# Patient Record
Sex: Female | Born: 1975 | Race: Black or African American | Hispanic: No | Marital: Married | Smoking: Never smoker
Health system: Southern US, Community
[De-identification: ages and names within clinical notes are randomized; demographics above are authoritative.]

## PROBLEM LIST (undated history)

## (undated) DIAGNOSIS — I10 Essential (primary) hypertension: Secondary | ICD-10-CM

---

## 2017-03-31 ENCOUNTER — Ambulatory Visit (INDEPENDENT_AMBULATORY_CARE_PROVIDER_SITE_OTHER): Payer: Self-pay | Admitting: *Deleted

## 2017-03-31 ENCOUNTER — Encounter: Payer: Self-pay | Admitting: *Deleted

## 2017-03-31 ENCOUNTER — Inpatient Hospital Stay (HOSPITAL_COMMUNITY)
Admission: AD | Admit: 2017-03-31 | Discharge: 2017-03-31 | Disposition: A | Discharge status: Home / Self Care | Payer: Self-pay | Source: Ambulatory Visit | Attending: Obstetrics and Gynecology | Admitting: Obstetrics and Gynecology

## 2017-03-31 DIAGNOSIS — Z538 Procedure and treatment not carried out for other reasons: Secondary | ICD-10-CM | POA: Insufficient documentation

## 2017-03-31 DIAGNOSIS — Z3200 Encounter for pregnancy test, result unknown: Secondary | ICD-10-CM

## 2017-03-31 DIAGNOSIS — Z3201 Encounter for pregnancy test, result positive: Secondary | ICD-10-CM

## 2017-03-31 LAB — POCT PREGNANCY, URINE: Preg Test, Ur: POSITIVE — AB

## 2017-03-31 NOTE — MAU Note (Signed)
Is preg, here for a check up.  Having no problems.  Denies any pain or  Bleeding.  +HPT 4 wks ago.

## 2017-03-31 NOTE — Progress Notes (Signed)
Here for pregnancy test which was positive. States not taking any medications now except folic acid. Not sure where she get prenatal care. States LMP was 02/07/17, states she has regular periods and is sure of date. Given letter for proof of pregnancy.

## 2017-03-31 NOTE — MAU Provider Note (Signed)
Patient presents to MAU in stable condition with reports of (+) HPT and desire to start prenatal care.  She has no complaints.   Advised to go to [place] and schedule OB appt and establish care there. She and her spouse went to [facility] first and was instructed to come here for prenatal care. Notified that MAU would be the location for her to come for [facility] emergencies and labor. Patient verbalized an understanding of the plan of care and agrees.  Patient and spouse given directions to [place] from MAU.   Laury Deep, CNM 03/31/2017 11:47 AM

## 2017-04-02 NOTE — Progress Notes (Signed)
Agree with nursing staff's documentation of this patient's clinic encounter.  Verita Schneiders, MD

## 2017-05-06 ENCOUNTER — Encounter: Payer: Self-pay | Admitting: Medical

## 2017-05-06 ENCOUNTER — Ambulatory Visit (INDEPENDENT_AMBULATORY_CARE_PROVIDER_SITE_OTHER): Payer: Self-pay | Admitting: Medical

## 2017-05-06 DIAGNOSIS — Z113 Encounter for screening for infections with a predominantly sexual mode of transmission: Secondary | ICD-10-CM

## 2017-05-06 DIAGNOSIS — O10911 Unspecified pre-existing hypertension complicating pregnancy, first trimester: Secondary | ICD-10-CM

## 2017-05-06 DIAGNOSIS — O34219 Maternal care for unspecified type scar from previous cesarean delivery: Secondary | ICD-10-CM

## 2017-05-06 DIAGNOSIS — Z124 Encounter for screening for malignant neoplasm of cervix: Secondary | ICD-10-CM

## 2017-05-06 DIAGNOSIS — Z1151 Encounter for screening for human papillomavirus (HPV): Secondary | ICD-10-CM

## 2017-05-06 DIAGNOSIS — O10919 Unspecified pre-existing hypertension complicating pregnancy, unspecified trimester: Secondary | ICD-10-CM | POA: Insufficient documentation

## 2017-05-06 DIAGNOSIS — O0990 Supervision of high risk pregnancy, unspecified, unspecified trimester: Secondary | ICD-10-CM | POA: Insufficient documentation

## 2017-05-06 DIAGNOSIS — O09521 Supervision of elderly multigravida, first trimester: Secondary | ICD-10-CM | POA: Insufficient documentation

## 2017-05-06 DIAGNOSIS — Z98891 History of uterine scar from previous surgery: Secondary | ICD-10-CM | POA: Insufficient documentation

## 2017-05-06 LAB — POCT URINALYSIS DIP (DEVICE)
Bilirubin Urine: NEGATIVE
GLUCOSE, UA: NEGATIVE mg/dL
Ketones, ur: NEGATIVE mg/dL
Leukocytes, UA: NEGATIVE
Nitrite: NEGATIVE
PH: 5.5 (ref 5.0–8.0)
PROTEIN: NEGATIVE mg/dL
SPECIFIC GRAVITY, URINE: 1.01 (ref 1.005–1.030)
UROBILINOGEN UA: 0.2 mg/dL (ref 0.0–1.0)

## 2017-05-06 NOTE — Progress Notes (Signed)
.    Subjective:    Sharon Harvey is a G2P1001 [redacted]w[redacted]d being seen today for her first obstetrical visit.  Her obstetrical history is significant for advanced maternal age and previous c-section. Patient does intend to breast feed. Pregnancy history fully reviewed. Last pregnancy was 5 years ago in [place]. It was c-section birth.  Baby weighed 3.4 kg. No history of hypertension. She is taking her prenatal vitamin.   Patient reports no complaints.  Vitals:   05/06/17 1450 05/06/17 1505 05/06/17 1507  BP: (!) 148/74  (!) 141/82  Pulse: 94  94  Weight: 195 lb 14.4 oz (88.9 kg)    Height:  5\' 4"  (1.626 m)     HISTORY: OB History  Gravida Para Term Preterm AB Living  2 1 1     1   SAB TAB Ectopic Multiple Live Births          1    # Outcome Date GA Lbr Len/2nd Weight Sex Delivery Anes PTL Lv  2 Current           1 Term 09/28/11 [redacted]w[redacted]d   F CS-LTranv Spinal N LIV     History reviewed. No pertinent past medical history. History reviewed. No pertinent surgical history. History reviewed. No pertinent family history.   Exam    Uterus:     Pelvic Exam:    Perineum: No Hemorrhoids, Normal Perineum   Vulva: normal   Vagina:  normal mucosa, normal discharge       Cervix: no cervical motion tenderness and no lesions   Adnexa: normal adnexa and no mass, fullness, tenderness      System:     Skin: normal coloration and turgor, no rashes    Neurologic: oriented, normal, normal mood   Extremities: normal strength, tone, and muscle mass   HEENT PERRLA and extra ocular movement intact   Mouth/Teeth mucous membranes moist, pharynx normal without lesions and dental hygiene good   Neck supple and no masses   Cardiovascular: regular rate and rhythm, no murmurs or gallops   Respiratory:  appears well, vitals normal, no respiratory distress, acyanotic, normal RR, ear and throat exam is normal, neck free of mass or lymphadenopathy, chest clear, no wheezing, crepitations, rhonchi, normal symmetric  air entry   Abdomen: soft, non-tender; bowel sounds normal; no masses,  no organomegaly   Urinary: urethral meatus normal      Assessment:    Pregnancy: G2P1001 Patient Active Problem List   Diagnosis Date Noted  . Supervision of other normal pregnancy, antepartum 05/06/2017  . History of cesarean section 05/06/2017  . AMA (advanced maternal age) multigravida 93+, first trimester 05/06/2017  . Chronic hypertension affecting pregnancy 05/06/2017        Plan:     Initial labs drawn. Prenatal vitamins. Problem list reviewed and updated. Genetic Screening discussed. Recommended genetic screening. Patient is self pay and not [place] Citizen, so she and husband are concerned about cost for this.  Ultrasound discussed; fetal survey: ordered.  Follow up in 4 weeks.  [employer] 05/06/2017

## 2017-05-08 LAB — OBSTETRIC PANEL, INCLUDING HIV
ANTIBODY SCREEN: NEGATIVE
Basophils Absolute: 0 10*3/uL (ref 0.0–0.2)
Basos: 0 %
EOS (ABSOLUTE): 0.1 10*3/uL (ref 0.0–0.4)
Eos: 1 %
HEMATOCRIT: 31.8 % — AB (ref 34.0–46.6)
HEMOGLOBIN: 10.9 g/dL — AB (ref 11.1–15.9)
HEP B S AG: NEGATIVE
HIV Screen 4th Generation wRfx: NONREACTIVE
IMMATURE GRANS (ABS): 0 10*3/uL (ref 0.0–0.1)
IMMATURE GRANULOCYTES: 0 %
Lymphocytes Absolute: 1.5 10*3/uL (ref 0.7–3.1)
Lymphs: 26 %
MCH: 22.1 pg — AB (ref 26.6–33.0)
MCHC: 34.3 g/dL (ref 31.5–35.7)
MCV: 65 fL — AB (ref 79–97)
MONOCYTES: 12 %
MONOS ABS: 0.7 10*3/uL (ref 0.1–0.9)
Neutrophils Absolute: 3.4 10*3/uL (ref 1.4–7.0)
Neutrophils: 61 %
Platelets: 277 10*3/uL (ref 150–379)
RBC: 4.93 x10E6/uL (ref 3.77–5.28)
RDW: 17.8 % — ABNORMAL HIGH (ref 12.3–15.4)
RPR: NONREACTIVE
Rh Factor: POSITIVE
Rubella Antibodies, IGG: <0.9 index — ABNORMAL LOW (ref >0.99)
WBC: 5.6 10*3/uL (ref 3.4–10.8)

## 2017-05-08 LAB — CYTOLOGY - PAP
CHLAMYDIA, DNA PROBE: NEGATIVE
Diagnosis: NEGATIVE
HPV (WINDOPATH): NOT DETECTED
NEISSERIA GONORRHEA: NEGATIVE

## 2017-05-08 LAB — URINE CULTURE, OB REFLEX

## 2017-05-08 LAB — HEMOGLOBINOPATHY EVALUATION
HEMOGLOBIN A2 QUANTITATION: 2.4 % (ref 1.8–3.2)
HEMOGLOBIN F QUANTITATION: 0 % (ref 0.0–2.0)
HGB A: 97.6 % (ref 96.4–98.8)
HGB C: 0 %
HGB S: 0 %
HGB VARIANT: 0 %

## 2017-05-08 LAB — AFP TUMOR MARKER: AFP, SERUM, TUMOR MARKER: 16.2 ng/mL — AB (ref 0.0–8.3)

## 2017-05-08 LAB — CULTURE, OB URINE

## 2017-05-14 ENCOUNTER — Telehealth: Payer: Self-pay | Admitting: *Deleted

## 2017-05-14 DIAGNOSIS — O09521 Supervision of elderly multigravida, first trimester: Secondary | ICD-10-CM

## 2017-05-14 DIAGNOSIS — Z3480 Encounter for supervision of other normal pregnancy, unspecified trimester: Secondary | ICD-10-CM

## 2017-05-14 DIAGNOSIS — O10919 Unspecified pre-existing hypertension complicating pregnancy, unspecified trimester: Secondary | ICD-10-CM

## 2017-05-14 NOTE — Telephone Encounter (Signed)
Sharon Harvey is 98 w6d and that is cutoff for nuchal.  She can have panorama or quad screen at next visit.

## 2017-05-14 NOTE — Telephone Encounter (Signed)
-----   Message from [place], [place] sent at 05/12/2017  2:32 PM EST ----- Please schedule patient for nuchal translucency ultrasound. She and I discussed this at her last visit.

## 2017-06-05 ENCOUNTER — Encounter: Payer: Self-pay | Admitting: Student

## 2017-06-05 ENCOUNTER — Ambulatory Visit (INDEPENDENT_AMBULATORY_CARE_PROVIDER_SITE_OTHER): Payer: Self-pay | Admitting: Student

## 2017-06-05 VITALS — BP 123/80 | HR 91 | Wt 198.6 lb

## 2017-06-05 DIAGNOSIS — Z3480 Encounter for supervision of other normal pregnancy, unspecified trimester: Secondary | ICD-10-CM

## 2017-06-05 DIAGNOSIS — O09521 Supervision of elderly multigravida, first trimester: Secondary | ICD-10-CM

## 2017-06-05 DIAGNOSIS — O09522 Supervision of elderly multigravida, second trimester: Secondary | ICD-10-CM

## 2017-06-05 DIAGNOSIS — O10919 Unspecified pre-existing hypertension complicating pregnancy, unspecified trimester: Secondary | ICD-10-CM

## 2017-06-05 DIAGNOSIS — O10912 Unspecified pre-existing hypertension complicating pregnancy, second trimester: Secondary | ICD-10-CM

## 2017-06-05 DIAGNOSIS — Z3482 Encounter for supervision of other normal pregnancy, second trimester: Secondary | ICD-10-CM

## 2017-06-05 DIAGNOSIS — Z98891 History of uterine scar from previous surgery: Secondary | ICD-10-CM

## 2017-06-05 NOTE — Patient Instructions (Signed)
Pregnancy After Age 41 Women who become pregnant after the age of 41 have a higher risk for certain problems during pregnancy. This is because older women may already have health problems before becoming pregnant. Older women who are healthy before pregnancy may still develop problems during pregnancy. These problems may affect the mother, the unborn baby (fetus), or both. What are the risks for me? If you are over age 14 and you want to become pregnant or are pregnant, you may have a higher risk of:  Not being able to get pregnant (infertility).  Going into labor early (preterm labor).  Needing surgical delivery of your baby (cesarean delivery, or C-section).  Having high blood pressure (hypertension).  Having complications during pregnancy, such as high blood pressure and other symptoms (preeclampsia).  Having diabetes during pregnancy (gestational diabetes).  Being pregnant with more than one baby.  Loss of the unborn baby before 20 weeks (miscarriage) or after 20 weeks of pregnancy (stillbirth).  What are the risks for my baby? Babies born to women over the age of 9 have a higher risk for:  Being born early (prematurity).  Low birth weight, which is less than 5 lb, 8 oz (2.5 kg).  Birth defects, such as Down syndrome and cleft palate.  Health complications, including problems with growth and development.  How is prenatal care different for women over age 30? All women should see their health care provider before they try to become pregnant. This is especially important for women over the age of 70. Tell your health care provider about:  Any health problems you have.  Any medicines you take.  Any family history of health problems or chromosome-related defects.  Any problems you have had with past pregnancies or deliveries.  If you are over age 57 and you plan to become pregnant:  Start taking a daily multivitamin a month or more before you try to get pregnant. Your  multivitamin should contain 400 mcg (micrograms) of folic acid.  If you are over age 43 and pregnant, make sure you:  Keep taking your multivitamin unless your health care provider tells you not to take it.  Keep all prenatal visits as told by your health care provider. This is important.  Have ultrasounds regularly throughout your pregnancy to check for problems.  Talk with your health care provider about other prenatal screening tests that you may need.  What additional prenatal tests are needed? Screening tests show whether your baby has a higher risk for birth defects than other babies. Screening tests include:  Ultrasound tests to look for markers that indicate a risk for birth defects.  Maternal blood screening. These are blood tests that measure certain substances in your blood to determine your baby's risk for defects.  Screening tests do not show whether your baby has or does not have defects. They only show your baby's risk for certain defects. If your screening tests show that risk factors are present, you may need tests to confirm the defect (diagnostic testing). These tests may include:  Chorionic villus sampling. For this procedure, a tissue sample is taken from the organ that forms in your uterus to nourish your baby (placenta). The sample is removed through your cervix or abdomen and tested.  Amniocentesis. For this procedure, a small amount of the fluid that surrounds the baby in the uterus (amniotic fluid) is removed and tested.  What can I do to stay healthy during my pregnancy? Staying healthy during pregnancy can help you and your  baby to have a lower risk for problems during pregnancy, during delivery, or both. Talk with your health care provider for specific instructions about staying healthy during your pregnancy. Nutrition  At each meal, eat a variety of foods from each of the five food groups. These groups include: ? Proteins such as lean meats, poultry, fish  that is low in fat, beans, eggs, and nuts. ? Vegetables such as leafy greens, raw and cooked vegetables, and vegetable juice. ? Fruits that are fresh, frozen, or canned, or 100% fruit juice. ? Dairy products such as low-fat yogurt, cheese, and milk. ? Whole grains including rice, cereal, pasta, and bread.  Talk with your health care provider about how much food in each group is right for you.  Follow instructions from your health care provider about eating and drinking restrictions during pregnancy. ? Do not eat raw eggs, raw meat, or raw fish or seafood. ? Do not eat any fish that contains high amounts of mercury, such as swordfish or mackerel.  Drink 6-8 or more glasses of water a day. You should drink enough fluid to keep your urine pale yellow. Managing weight gain  Ask your health care provider how much weight gain is healthy during pregnancy.  Stay at a healthy weight. If needed, work with your health care provider to lose weight safely. Activity  Exercise regularly, as directed by your health care provider. Ask your health care provider what forms of exercise are safe for you. General instructions  Do not use any products that contain nicotine or tobacco, such as cigarettes and e-cigarettes. If you need help quitting, ask your health care provider.  Do not drink alcohol, use drugs, or abuse prescription medicine.  Take over-the-counter and prescription medicines only as told by your health care provider.  Do not use hot tubs, steam rooms, or saunas.  Talk with your health care provider about your risk of exposure to harmful environmental conditions. This includes exposure to chemicals, radiation, cleaning products, and cat feces. Follow advice from your health care provider about how to limit your exposure. Summary  Women who become pregnant after the age of 54 have a higher risk for complications during pregnancy.  Problems may affect the mother, the unborn baby (fetus),  or both.  All women should see their health care provider before they try to become pregnant. This is especially important for women over the age of 77.  Staying healthy during pregnancy can help both you and your baby to have a lower risk for some of the problems that can happen during pregnancy, during delivery, or both. This information is not intended to replace advice given to you by your health care provider. Make sure you discuss any questions you have with your health care provider. Document Released: 09/16/2016 Document Revised: 09/16/2016 Document Reviewed: 09/16/2016 Elsevier Interactive Patient Education  2018 [employer].

## 2017-06-05 NOTE — Progress Notes (Signed)
   PRENATAL VISIT NOTE  Subjective:  Sharon Harvey is a 41 y.o. G2P1001 at [redacted]w[redacted]d being seen today for ongoing prenatal care.  She is currently monitored for the following issues for this high-risk pregnancy and has Supervision of other normal pregnancy, antepartum; History of cesarean section; AMA (advanced maternal age) multigravida 5+, first trimester; and Chronic hypertension affecting pregnancy on their problem list.  Patient reports no complaints.  Contractions: Not present. Vag. Bleeding: None.  Movement: Present. Denies leaking of fluid.   The following portions of the patient's history were reviewed and updated as appropriate: allergies, current medications, past family history, past medical history, past social history, past surgical history and problem list. Problem list updated.  Objective:   Vitals:   06/05/17 1603  BP: 123/80  Pulse: 91  Weight: 198 lb 9.6 oz (90.1 kg)    Fetal Status: Fetal Heart Rate (bpm): 145   Movement: Present     General:  Alert, oriented and cooperative. Patient is in no acute distress.  Skin: Skin is warm and dry. No rash noted.   Cardiovascular: Normal heart rate noted  Respiratory: Normal respiratory effort, no problems with respiration noted  Abdomen: Soft, gravid, appropriate for gestational age.  Pain/Pressure: Absent     Pelvic: Cervical exam deferred        Extremities: Normal range of motion.  Edema: None  Mental Status:  Normal mood and affect. Normal behavior. Normal judgment and thought content.   Assessment and Plan:  Pregnancy: G2P1001 at [redacted]w[redacted]d  1. Supervision of other normal pregnancy, antepartum Doing well; patient wants to get on pregnancy medicaid before she gets her [place].   2. AMA (advanced maternal age) multigravida 63+, first trimester Will offer genetic counseling at next visit (57 weeks) Knows that we will be doing extra monitoring during pregnancy because of her age.   3. Chronic hypertension affecting pregnancy BP  normal today  4. History of cesarean section Will discuss VBAC at next visit  Preterm labor symptoms and general obstetric precautions including but not limited to vaginal bleeding, contractions, leaking of fluid and fetal movement were reviewed in detail with the patient. Please refer to After Visit Summary for other counseling recommendations.  Return in about 4 weeks (around 07/03/2017).   Starr Lake, CNM

## 2017-06-10 NOTE — L&D Delivery Note (Addendum)
Delivery Note At 4:46 PM a viable female was delivered via Vaginal, Spontaneous (Presentation:vertex ; ROA  ).  APGAR: 5, 8; weight 7 lb 2.1 oz (3235 g).   Placenta status: spontaneous , intact .  Cord: 3 vessel  with the following complications: none .  Cord pH: 7.2  Anesthesia:  Epidural Episiotomy:  no Lacerations:  Periurethral, sulcal and second degree perineal Suture Repair: 3.0 monocryl Est. Blood Loss (mL):  1030ml  Sharon Harvey is a 42 y.o. female now G2P2002 IUP at [redacted]w[redacted]d admitted for SROM @6  PM .  She progressed with augmentation with Pitocin to complete and pushed about 5 minutes to deliver.  Cord clamped immediately due to late decels right before delivery. Cord pH sent. Placenta intact and spontaneous, significant bleeding 2/2 multiple tears, mostly coming from deep sulcal tear.  All lacerations repaired without difficulty.  Mom and baby stable prior to transfer to postpartum. She plans on breastfeeding.   Mom to postpartum.  Baby to Couplet care / Skin to Skin.  [name], MD, PGY-1 [facility] [place] 11/03/2017, 6:08 PM  OB FELLOW DELIVERY ATTESTATION  I was gloved and present for the delivery in its entirety, and I agree with the above resident's note.    VBAC.  Deep sulcal laceration with significant bleeding. Repaired with 2.0 Vicryl, and 2nd perineal laceration repaired with 3.0 Monocryl with good hemostasis achieved. QBL 1040 mL (mostly from lacerations). Fundus firm.   Gailen Shelter, MD OB Fellow 11:22 PM

## 2017-07-04 ENCOUNTER — Ambulatory Visit (INDEPENDENT_AMBULATORY_CARE_PROVIDER_SITE_OTHER): Payer: Medicaid Other | Admitting: Obstetrics and Gynecology

## 2017-07-04 VITALS — BP 127/73 | HR 110 | Wt 208.0 lb

## 2017-07-04 DIAGNOSIS — O09522 Supervision of elderly multigravida, second trimester: Secondary | ICD-10-CM

## 2017-07-04 DIAGNOSIS — Z98891 History of uterine scar from previous surgery: Secondary | ICD-10-CM

## 2017-07-04 DIAGNOSIS — O09521 Supervision of elderly multigravida, first trimester: Secondary | ICD-10-CM

## 2017-07-04 DIAGNOSIS — O10912 Unspecified pre-existing hypertension complicating pregnancy, second trimester: Secondary | ICD-10-CM

## 2017-07-04 DIAGNOSIS — O0990 Supervision of high risk pregnancy, unspecified, unspecified trimester: Secondary | ICD-10-CM

## 2017-07-04 DIAGNOSIS — O0992 Supervision of high risk pregnancy, unspecified, second trimester: Secondary | ICD-10-CM

## 2017-07-04 DIAGNOSIS — O10919 Unspecified pre-existing hypertension complicating pregnancy, unspecified trimester: Secondary | ICD-10-CM

## 2017-07-04 NOTE — Progress Notes (Signed)
Subjective:  Sharon Harvey is a 42 y.o. G2P1001 at [redacted]w[redacted]d being seen today for ongoing prenatal care.  She is currently monitored for the following issues for this high-risk pregnancy and has Supervision of high risk pregnancy, antepartum; History of cesarean section; AMA (advanced maternal age) multigravida 4+, first trimester; and Chronic hypertension affecting pregnancy on their problem list.  Patient reports no complaints.  Contractions: Not present. Vag. Bleeding: None.  Movement: Present. Denies leaking of fluid.   The following portions of the patient's history were reviewed and updated as appropriate: allergies, current medications, past family history, past medical history, past social history, past surgical history and problem list. Problem list updated.  Objective:   Vitals:   07/04/17 1614 07/04/17 1623  BP: 134/81 127/73  Pulse: (!) 110   Weight: 94.3 kg (208 lb)     Fetal Status: Fetal Heart Rate (bpm): 148 Fundal Height: 20 cm Movement: Present     General:  Alert, oriented and cooperative. Patient is in no acute distress.  Skin: Skin is warm and dry. No rash noted.   Cardiovascular: Normal heart rate noted  Respiratory: Normal respiratory effort, no problems with respiration noted  Abdomen: Soft, gravid, appropriate for gestational age. Pain/Pressure: Absent     Pelvic: Vag. Bleeding: None     Cervical exam deferred        Extremities: Normal range of motion.  Edema: None  Mental Status: Normal mood and affect. Normal behavior. Normal judgment and thought content.   Urinalysis:      Assessment and Plan:  Pregnancy: G2P1001 at [redacted]w[redacted]d  1. Chronic hypertension affecting pregnancy BPs controlled today. Not on medications.   2. Supervision of high risk pregnancy, antepartum Doing well. Anatomy US scheduled.   3. AMA (advanced maternal age) multigravida 59+, first trimester Patient offered genetic counseling but declined at this time. Wants to wait until [place] results.  Also patient was ordered wrong quad screen so unable to interpret results. Discussed need to redraw and patient declined at this time. Offer again at next visit.  4. History of cesarean section Risk and benefits for repeat c/s and VBAC discussed. Patient unsure of if she wants to VBAC. Discuss again at next visit. Prior c/s was an elective section without medical indication per patient. This was done [place].  Preterm labor symptoms and general obstetric precautions including but not limited to vaginal bleeding, contractions, leaking of fluid and fetal movement were reviewed in detail with the patient. Please refer to After Visit Summary for other counseling recommendations.  Return in about 4 weeks (around 08/01/2017) for ob visit.   Katheren Shams, DO

## 2017-07-05 ENCOUNTER — Encounter: Payer: Self-pay | Admitting: Obstetrics and Gynecology

## 2017-07-08 ENCOUNTER — Other Ambulatory Visit: Payer: Self-pay

## 2017-07-08 DIAGNOSIS — O10919 Unspecified pre-existing hypertension complicating pregnancy, unspecified trimester: Secondary | ICD-10-CM

## 2017-07-08 DIAGNOSIS — O09521 Supervision of elderly multigravida, first trimester: Secondary | ICD-10-CM

## 2017-07-10 ENCOUNTER — Encounter (HOSPITAL_COMMUNITY): Payer: Self-pay

## 2017-07-10 ENCOUNTER — Other Ambulatory Visit: Payer: Self-pay | Admitting: Family Medicine

## 2017-07-10 ENCOUNTER — Ambulatory Visit (HOSPITAL_COMMUNITY)
Admission: RE | Admit: 2017-07-10 | Discharge: 2017-07-10 | Disposition: A | Discharge status: Home / Self Care | Payer: Medicaid Other | Source: Ambulatory Visit | Attending: Family Medicine | Admitting: Family Medicine

## 2017-07-10 DIAGNOSIS — Z3689 Encounter for other specified antenatal screening: Secondary | ICD-10-CM

## 2017-07-10 DIAGNOSIS — Z3A22 22 weeks gestation of pregnancy: Secondary | ICD-10-CM | POA: Diagnosis not present

## 2017-07-10 DIAGNOSIS — O10012 Pre-existing essential hypertension complicating pregnancy, second trimester: Secondary | ICD-10-CM | POA: Insufficient documentation

## 2017-07-10 DIAGNOSIS — O09521 Supervision of elderly multigravida, first trimester: Secondary | ICD-10-CM

## 2017-07-10 DIAGNOSIS — O09522 Supervision of elderly multigravida, second trimester: Secondary | ICD-10-CM

## 2017-07-10 DIAGNOSIS — O10919 Unspecified pre-existing hypertension complicating pregnancy, unspecified trimester: Secondary | ICD-10-CM

## 2017-07-10 DIAGNOSIS — O0990 Supervision of high risk pregnancy, unspecified, unspecified trimester: Secondary | ICD-10-CM

## 2017-07-10 DIAGNOSIS — O34219 Maternal care for unspecified type scar from previous cesarean delivery: Secondary | ICD-10-CM | POA: Diagnosis not present

## 2017-07-10 NOTE — Progress Notes (Signed)
Genetic Counseling  High-Risk Gestation Note  Appointment Date:  07/10/2017 Referred By: Dannielle Huh, DO Date of Birth:  08/27/1975   Pregnancy History: G2P1001 Estimated Date of Delivery: 11/13/17 Estimated Gestational Age: [redacted]w[redacted]d Attending: Griffin Dakin, MD  [name] was seen for genetic counseling because of a maternal age of 42 y.o..     In summary:  Discussed AMA and associated risk for fetal aneuploidy  Discussed options for screening  Too far advanced gestation for maternal serum screening (Quad)  NIPS- declined  Ultrasound- performed today; see separate report. Follow-up scheduled 08/07/17  Discussed diagnostic testing options  Amniocentesis- declined  Reviewed family history concerns  Discussed carrier screening options  CF  SMA  Hemoglobinopathies  She was counseled regarding maternal age and the association with risk for chromosome conditions due to nondisjunction with aging of the ova.   We reviewed chromosomes, nondisjunction, and the associated 1 in 25 risk for fetal aneuploidy related to a maternal age of 42 y.o. at [redacted]w[redacted]d gestation.  She was counseled that the risk for aneuploidy decreases as gestational age increases, accounting for those pregnancies which spontaneously abort.  We specifically discussed Down syndrome (trisomy 87), trisomies 8 and 77, and sex chromosome aneuploidies (47,XXX and 47,XXY) including the common features and prognoses of each.   We reviewed available screening options including noninvasive prenatal screening (NIPS)/cell free DNA (cfDNA) screening, and detailed ultrasound.  She was counseled that screening tests are used to modify a patient's a priori risk for aneuploidy, typically based on age. This estimate provides a pregnancy specific risk assessment. We reviewed the benefits and limitations of each option. Specifically, we discussed the conditions for which each test screens, the detection rates, and false positive rates of  each. She was also counseled regarding diagnostic testing via amniocentesis. We reviewed the approximate 1 in [phone] risk for complications from amniocentesis, including spontaneous pregnancy loss. We discussed the possible results that the tests might provide including: positive, negative, unanticipated, and no result. Finally, they were counseled regarding the cost of each option and potential out of pocket expenses.   After consideration of all the options, she declined NIPS and amniocentesis, electing for ultrasound only in pregnancy.     Ultrasound was performed today. The ultrasound report will be sent under separate cover. There were no visualized fetal anomalies or markers suggestive of aneuploidy. However, some fetal views were suboptimal due to fetal position. Follow-up ultrasound is scheduled for 08/07/17. Diagnostic testing was declined today.  She understands that screening tests cannot rule out all birth defects or genetic syndromes. The patient was advised of this limitation and states she still does not want additional testing at this time.   [name]  was provided with written information regarding cystic fibrosis (CF), spinal muscular atrophy (SMA) and hemoglobinopathies including the carrier frequency, availability of carrier screening and prenatal diagnosis if indicated.  In addition, we discussed that CF and hemoglobinopathies are routinely screened for as part of the [place] newborn screening panel. Hemoglobin electrophoresis was previously performed and was within normal limits. She declined screening for CF and SMA.  Both family histories were reviewed and found to be noncontributory for birth defects, intellectual disability, and known genetic conditions. The couple are from [place]. Consanguinity was denied. Without further information regarding the provided family history, an accurate genetic risk cannot be calculated. Further genetic counseling is warranted if more information  is obtained.  [name] denied exposure to environmental toxins or chemical agents. She denied the  use of alcohol, tobacco or street drugs. She denied significant viral illnesses during the course of her pregnancy.   I counseled [name] regarding the above risks and available options.  The approximate face-to-face time with the genetic counselor was 35 minutes.  Chipper Oman, MS,  Certified Genetic Counselor 07/10/2017

## 2017-07-11 ENCOUNTER — Other Ambulatory Visit (HOSPITAL_COMMUNITY): Payer: Self-pay | Admitting: *Deleted

## 2017-07-11 DIAGNOSIS — O09522 Supervision of elderly multigravida, second trimester: Secondary | ICD-10-CM

## 2017-08-01 ENCOUNTER — Ambulatory Visit (INDEPENDENT_AMBULATORY_CARE_PROVIDER_SITE_OTHER): Payer: Medicaid Other | Admitting: Certified Nurse Midwife

## 2017-08-01 ENCOUNTER — Encounter: Payer: Self-pay | Admitting: Certified Nurse Midwife

## 2017-08-01 VITALS — BP 126/75 | HR 106 | Wt 207.0 lb

## 2017-08-01 DIAGNOSIS — O10912 Unspecified pre-existing hypertension complicating pregnancy, second trimester: Secondary | ICD-10-CM

## 2017-08-01 DIAGNOSIS — Z98891 History of uterine scar from previous surgery: Secondary | ICD-10-CM

## 2017-08-01 DIAGNOSIS — O10919 Unspecified pre-existing hypertension complicating pregnancy, unspecified trimester: Secondary | ICD-10-CM

## 2017-08-01 DIAGNOSIS — O0990 Supervision of high risk pregnancy, unspecified, unspecified trimester: Secondary | ICD-10-CM

## 2017-08-01 DIAGNOSIS — O0992 Supervision of high risk pregnancy, unspecified, second trimester: Secondary | ICD-10-CM

## 2017-08-01 NOTE — Progress Notes (Signed)
   PRENATAL VISIT NOTE  Subjective:  Sharon Harvey is a 42 y.o. G2P1001 at [redacted]w[redacted]d being seen today for ongoing prenatal care.  She is currently monitored for the following issues for this low-risk pregnancy and has Supervision of high risk pregnancy, antepartum; History of cesarean section; AMA (advanced maternal age) multigravida 80+, first trimester; Chronic hypertension affecting pregnancy; and [redacted] weeks gestation of pregnancy on their problem list.  Patient reports no complaints.  Contractions: Not present. Vag. Bleeding: None.  Movement: Present. Denies leaking of fluid.   The following portions of the patient's history were reviewed and updated as appropriate: allergies, current medications, past family history, past medical history, past social history, past surgical history and problem list. Problem list updated.  Objective:   Vitals:   08/01/17 1559  BP: 126/75  Pulse: (!) 106  Weight: 207 lb (93.9 kg)    Fetal Status: Fetal Heart Rate (bpm): 141 Fundal Height: 25 cm Movement: Present     General:  Alert, oriented and cooperative. Patient is in no acute distress.  Skin: Skin is warm and dry. No rash noted.   Cardiovascular: Normal heart rate noted  Respiratory: Normal respiratory effort, no problems with respiration noted  Abdomen: Soft, gravid, appropriate for gestational age.  Pain/Pressure: Absent     Pelvic: Cervical exam deferred        Extremities: Normal range of motion.  Edema: None  Mental Status:  Normal mood and affect. Normal behavior. Normal judgment and thought content.   Assessment and Plan:  Pregnancy: G2P1001 at [redacted]w[redacted]d  1. Supervision of high risk pregnancy, antepartum -pt doing well with no complaints  -anticipatory guidance on upcoming visit with 2hr GTT, discussed need to fast at next appointment- patient verbalizes understanding   2. History of cesarean section -Pt plans TOLAC, needs consent signed prior to delivery   3. Chronic hypertension  affecting pregnancy -BP stable at this time, pt denies being on medication for hypertension   Preterm labor symptoms and general obstetric precautions including but not limited to vaginal bleeding, contractions, leaking of fluid and fetal movement were reviewed in detail with the patient. Please refer to After Visit Summary for other counseling recommendations.  Return in about 3 weeks (around 08/22/2017) for ROB/2hrGTT.   Lajean Manes, CNM

## 2017-08-01 NOTE — Patient Instructions (Signed)
AREA PEDIATRIC/FAMILY PRACTICE PHYSICIANS  [facility] [address], [address] [place], [place]  [address] Phone - [phone]   Fax - [phone]  [facility] [address] [place], [place] [address] Phone - [phone]   Fax - [phone]  [facility] [address] [place], [place]  [address] Phone - [phone]   Fax - [phone]  [facility] [address] [place], [place]  [address] Phone - [phone]   Fax - [phone]  [facility] [address] [place], [place]  [address] Phone - [phone]   Fax - [phone]  [facility] [address] [place], [place]  [address] Phone - [phone]   Fax - [phone]  [facility] [address] [place], [place]  [address] Phone - [phone]   Fax - [phone]  [facility] [address] [place], [place]  [address] Phone - [phone]   Fax - [phone]  [facility] [address] [place], [place]  [address] Phone - [phone]   Fax - [phone] [facility] [address] [place], [place]  [address] Phone - [phone]   Fax - [phone]  [facility] [address] [place], [place]  [address] Phone - [phone]   Fax - [phone]  [facility] [address] [place], [place]  [address] Phone - [phone]   Fax - [phone]  [facility] [address] [place], [place]  [address] Phone - [phone]   Fax - [phone]  [facility]IL[facility] GOSRANI [address] [place], [place]  [address] Phone - [phone]  [facility] [address] [place], [place]  [address] Phone - [phone]   Fax - [phone]  [facility] [address] [place], [place]  [address] Phone - [phone]   Fax - [phone]  [facility] [address] [place], [place]  [address] Phone - [phone]   Fax - [phone]  [facility] [address] [place], [place]  [address] Phone - [phone]   Fax - [phone]   [facility] [address] [place], [place]  [address] Phone - [phone]   Fax - [phone]  [facility] [address] [place], [place]  [address] Phone - [phone]   Fax - [phone]  DAV[facility] RUB[facility] [address] [place], [place]  [address] Phone - [phone]   Fax - [phone]  [facility] [address] [place], [place]  [address] Phone - [phone]   Fax - [phone]  [facility] [address] , [place]  [address] Phone - [phone]   Fax - [phone] [facility] [address] [place], [place]  [address] Phone - [phone]   Fax - [phone]  [facility] [address] [place], [place]  [address] Phone - [phone]   Fax - [phone]  [facility] [address] [place], [place]  [address] Phone - [phone]   Fax - [phone]  [facility] [place] Charlene Flemming MD [address]   [address] Phone [phone]  Fax [phone]   

## 2017-08-07 ENCOUNTER — Encounter (HOSPITAL_COMMUNITY): Payer: Self-pay

## 2017-08-07 ENCOUNTER — Other Ambulatory Visit (HOSPITAL_COMMUNITY): Payer: Self-pay | Admitting: Obstetrics and Gynecology

## 2017-08-07 ENCOUNTER — Ambulatory Visit (HOSPITAL_COMMUNITY)
Admission: RE | Admit: 2017-08-07 | Discharge: 2017-08-07 | Disposition: A | Discharge status: Home / Self Care | Payer: Medicaid Other | Source: Ambulatory Visit | Attending: Family Medicine | Admitting: Family Medicine

## 2017-08-07 ENCOUNTER — Other Ambulatory Visit (HOSPITAL_COMMUNITY): Payer: Self-pay | Admitting: *Deleted

## 2017-08-07 DIAGNOSIS — O09522 Supervision of elderly multigravida, second trimester: Secondary | ICD-10-CM | POA: Insufficient documentation

## 2017-08-07 DIAGNOSIS — O10919 Unspecified pre-existing hypertension complicating pregnancy, unspecified trimester: Secondary | ICD-10-CM

## 2017-08-07 DIAGNOSIS — Z362 Encounter for other antenatal screening follow-up: Secondary | ICD-10-CM | POA: Diagnosis not present

## 2017-08-07 DIAGNOSIS — Z3A26 26 weeks gestation of pregnancy: Secondary | ICD-10-CM | POA: Diagnosis not present

## 2017-08-07 DIAGNOSIS — O10012 Pre-existing essential hypertension complicating pregnancy, second trimester: Secondary | ICD-10-CM | POA: Insufficient documentation

## 2017-08-07 DIAGNOSIS — O34219 Maternal care for unspecified type scar from previous cesarean delivery: Secondary | ICD-10-CM | POA: Insufficient documentation

## 2017-08-22 ENCOUNTER — Other Ambulatory Visit: Payer: Medicaid Other

## 2017-08-22 ENCOUNTER — Encounter: Payer: Medicaid Other | Admitting: Student

## 2017-08-25 ENCOUNTER — Other Ambulatory Visit: Payer: Medicaid Other

## 2017-08-25 ENCOUNTER — Ambulatory Visit (INDEPENDENT_AMBULATORY_CARE_PROVIDER_SITE_OTHER): Payer: Medicaid Other | Admitting: Advanced Practice Midwife

## 2017-08-25 ENCOUNTER — Encounter: Payer: Self-pay | Admitting: Advanced Practice Midwife

## 2017-08-25 VITALS — BP 117/71 | HR 93 | Wt 203.9 lb

## 2017-08-25 DIAGNOSIS — O10913 Unspecified pre-existing hypertension complicating pregnancy, third trimester: Secondary | ICD-10-CM | POA: Diagnosis not present

## 2017-08-25 DIAGNOSIS — O10919 Unspecified pre-existing hypertension complicating pregnancy, unspecified trimester: Secondary | ICD-10-CM

## 2017-08-25 DIAGNOSIS — O09521 Supervision of elderly multigravida, first trimester: Secondary | ICD-10-CM

## 2017-08-25 DIAGNOSIS — O09523 Supervision of elderly multigravida, third trimester: Secondary | ICD-10-CM

## 2017-08-25 DIAGNOSIS — O0990 Supervision of high risk pregnancy, unspecified, unspecified trimester: Secondary | ICD-10-CM

## 2017-08-25 DIAGNOSIS — Z98891 History of uterine scar from previous surgery: Secondary | ICD-10-CM

## 2017-08-25 DIAGNOSIS — Z23 Encounter for immunization: Secondary | ICD-10-CM | POA: Diagnosis not present

## 2017-08-25 DIAGNOSIS — O0993 Supervision of high risk pregnancy, unspecified, third trimester: Secondary | ICD-10-CM

## 2017-08-25 NOTE — Progress Notes (Signed)
   PRENATAL VISIT NOTE  Subjective:  Sharon Harvey is a 42 y.o. G2P1001 at [redacted]w[redacted]d being seen today for ongoing prenatal care.  She is currently monitored for the following issues for this high-risk pregnancy and has Supervision of high risk pregnancy, antepartum; History of cesarean section; AMA (advanced maternal age) multigravida 47+, first trimester; Chronic hypertension affecting pregnancy; and [redacted] weeks gestation of pregnancy on their problem list.  Patient reports no complaints.  Contractions: Not present. Vag. Bleeding: None.  Movement: Present. Denies leaking of fluid.   The following portions of the patient's history were reviewed and updated as appropriate: allergies, current medications, past family history, past medical history, past social history, past surgical history and problem list. Problem list updated.  Objective:   Vitals:   08/25/17 1003  BP: 117/71  Pulse: 93  Weight: 203 lb 14.4 oz (92.5 kg)    Fetal Status: Fetal Heart Rate (bpm): 151 Fundal Height: 29 cm Movement: Present     General:  Alert, oriented and cooperative. Patient is in no acute distress.  Skin: Skin is warm and dry. No rash noted.   Cardiovascular: Normal heart rate noted  Respiratory: Normal respiratory effort, no problems with respiration noted  Abdomen: Soft, gravid, appropriate for gestational age.  Pain/Pressure: Absent     Pelvic: Cervical exam deferred        Extremities: Normal range of motion.  Edema: None  Mental Status:  Normal mood and affect. Normal behavior. Normal judgment and thought content.   Assessment and Plan:  Pregnancy: G2P1001 at [redacted]w[redacted]d  1. AMA (advanced maternal age) multigravida 59+, first trimester   2. Supervision of high risk pregnancy, antepartum - 2 hour GTT - Previous c-section x1 6 years ago in [place]. Patient states that the c-section was done because the hospital workers were going on strike, and it was 5 days before her due date. No other problems with the  pregnancy. Patient did not go into labor.   3. Chronic hypertension affecting pregnancy - No meds at this time   Preterm labor symptoms and general obstetric precautions including but not limited to vaginal bleeding, contractions, leaking of fluid and fetal movement were reviewed in detail with the patient. Please refer to After Visit Summary for other counseling recommendations.  Return in about 2 weeks (around 09/08/2017).   Marcille Buffy, CNM

## 2017-08-26 LAB — CBC
Hematocrit: 30.3 % — ABNORMAL LOW (ref 34.0–46.6)
Hemoglobin: 9.9 g/dL — ABNORMAL LOW (ref 11.1–15.9)
MCH: 22.5 pg — ABNORMAL LOW (ref 26.6–33.0)
MCHC: 32.7 g/dL (ref 31.5–35.7)
MCV: 69 fL — AB (ref 79–97)
PLATELETS: 200 10*3/uL (ref 150–379)
RBC: 4.4 x10E6/uL (ref 3.77–5.28)
RDW: 18.6 % — AB (ref 12.3–15.4)
WBC: 8.4 10*3/uL (ref 3.4–10.8)

## 2017-08-26 LAB — RPR: RPR Ser Ql: NONREACTIVE

## 2017-08-26 LAB — GLUCOSE TOLERANCE, 2 HOURS W/ 1HR
GLUCOSE, 1 HOUR: 120 mg/dL (ref 65–179)
GLUCOSE, 2 HOUR: 111 mg/dL (ref 65–152)
Glucose, Fasting: 77 mg/dL (ref 65–91)

## 2017-08-26 LAB — HIV ANTIBODY (ROUTINE TESTING W REFLEX): HIV Screen 4th Generation wRfx: NONREACTIVE

## 2017-09-01 ENCOUNTER — Encounter: Payer: Self-pay | Admitting: *Deleted

## 2017-09-08 ENCOUNTER — Other Ambulatory Visit (HOSPITAL_COMMUNITY): Payer: Self-pay | Admitting: *Deleted

## 2017-09-08 ENCOUNTER — Ambulatory Visit (HOSPITAL_COMMUNITY)
Admission: RE | Admit: 2017-09-08 | Discharge: 2017-09-08 | Disposition: A | Discharge status: Home / Self Care | Payer: Medicaid Other | Source: Ambulatory Visit | Attending: Family Medicine | Admitting: Family Medicine

## 2017-09-08 ENCOUNTER — Ambulatory Visit (INDEPENDENT_AMBULATORY_CARE_PROVIDER_SITE_OTHER): Payer: Medicaid Other | Admitting: Advanced Practice Midwife

## 2017-09-08 ENCOUNTER — Encounter: Payer: Self-pay | Admitting: Advanced Practice Midwife

## 2017-09-08 ENCOUNTER — Encounter (HOSPITAL_COMMUNITY): Payer: Self-pay

## 2017-09-08 VITALS — BP 129/81 | HR 107 | Wt 214.6 lb

## 2017-09-08 DIAGNOSIS — Z3A30 30 weeks gestation of pregnancy: Secondary | ICD-10-CM | POA: Diagnosis not present

## 2017-09-08 DIAGNOSIS — O0993 Supervision of high risk pregnancy, unspecified, third trimester: Secondary | ICD-10-CM

## 2017-09-08 DIAGNOSIS — O10013 Pre-existing essential hypertension complicating pregnancy, third trimester: Secondary | ICD-10-CM | POA: Insufficient documentation

## 2017-09-08 DIAGNOSIS — O34219 Maternal care for unspecified type scar from previous cesarean delivery: Secondary | ICD-10-CM | POA: Diagnosis present

## 2017-09-08 DIAGNOSIS — O10919 Unspecified pre-existing hypertension complicating pregnancy, unspecified trimester: Secondary | ICD-10-CM

## 2017-09-08 DIAGNOSIS — O09521 Supervision of elderly multigravida, first trimester: Secondary | ICD-10-CM

## 2017-09-08 DIAGNOSIS — O0990 Supervision of high risk pregnancy, unspecified, unspecified trimester: Secondary | ICD-10-CM

## 2017-09-08 DIAGNOSIS — O09523 Supervision of elderly multigravida, third trimester: Secondary | ICD-10-CM | POA: Insufficient documentation

## 2017-09-08 DIAGNOSIS — O10913 Unspecified pre-existing hypertension complicating pregnancy, third trimester: Secondary | ICD-10-CM

## 2017-09-08 LAB — POCT URINALYSIS DIP (DEVICE)
BILIRUBIN URINE: NEGATIVE
GLUCOSE, UA: NEGATIVE mg/dL
Hgb urine dipstick: NEGATIVE
KETONES UR: NEGATIVE mg/dL
LEUKOCYTES UA: NEGATIVE
Nitrite: NEGATIVE
Protein, ur: NEGATIVE mg/dL
Urobilinogen, UA: 0.2 mg/dL (ref 0.0–1.0)
pH: 7 (ref 5.0–8.0)

## 2017-09-08 NOTE — Addendum Note (Signed)
Encounter addended by: Berlinda Last, RTR on: 09/08/2017 10:00 AM  Actions taken: Imaging Exam ended

## 2017-09-08 NOTE — Progress Notes (Signed)
Declined flu shot

## 2017-09-08 NOTE — Progress Notes (Signed)
   PRENATAL VISIT NOTE  Subjective:  Sharon Harvey is a 42 y.o. G2P1001 at [redacted]w[redacted]d being seen today for ongoing prenatal care.  She is currently monitored for the following issues for this high-risk pregnancy and has Supervision of high risk pregnancy, antepartum; History of cesarean section; AMA (advanced maternal age) multigravida 81+, first trimester; Chronic hypertension affecting pregnancy; and [redacted] weeks gestation of pregnancy on their problem list.  Patient reports no complaints.  Contractions: Not present. Vag. Bleeding: None.  Movement: Present. Denies leaking of fluid.   The following portions of the patient's history were reviewed and updated as appropriate: allergies, current medications, past family history, past medical history, past social history, past surgical history and problem list. Problem list updated.  Objective:   Vitals:   09/08/17 0833  BP: 129/81  Pulse: (!) 107  Weight: 214 lb 9.6 oz (97.3 kg)    Fetal Status: Fetal Heart Rate (bpm): 143 Fundal Height: 31 cm Movement: Present     General:  Alert, oriented and cooperative. Patient is in no acute distress.  Skin: Skin is warm and dry. No rash noted.   Cardiovascular: Normal heart rate noted  Respiratory: Normal respiratory effort, no problems with respiration noted  Abdomen: Soft, gravid, appropriate for gestational age.  Pain/Pressure: Absent     Pelvic: Cervical exam deferred        Extremities: Normal range of motion.  Edema: None  Mental Status: Normal mood and affect. Normal behavior. Normal judgment and thought content.   Assessment and Plan:  Pregnancy: G2P1001 at [redacted]w[redacted]d  1. Chronic hypertension affecting pregnancy - No meds - Antenatal testing starting at 32 weeks, next visit   INDICATION U/S NST/AFI DELIVERY       CHTN - O10.919  Group I   BP < 140/90, no preeclampsia, AGA,  nml AFV, +/- meds    Group II   BP > 140/90, on meds, no preeclampsia, AGA, nml AFV   20-28-34-38  20-24-28-32-35-38  32//2 x wk  28//BPP wkly then 32//2 x wk or BPP wkly  40 no meds; 39 meds  PRN or 37     2. Supervision of high risk pregnancy, antepartum - Has FU [place] with MFM today   3. AMA (advanced maternal age) multigravida 58+, first trimester   Advanced Maternal Age > 6 y.o. - O23.519 (primip), O09.529 (multip) [place]: 20-24-28-32-36 Testing: 36//2 x wk Delivery 40    Preterm labor symptoms and general obstetric precautions including but not limited to vaginal bleeding, contractions, leaking of fluid and fetal movement were reviewed in detail with the patient. Please refer to After Visit Summary for other counseling recommendations.  Return in about 2 weeks (around 09/22/2017).  Future Appointments  Date Time Provider [place]  09/08/2017 10:30 AM [place] 1 WH-MFCUS MFC-US  09/22/2017  8:15 AM [place], CNM WOC-WOCA WOC    Marcille Buffy, CNM

## 2017-09-22 ENCOUNTER — Encounter: Payer: Self-pay | Admitting: Obstetrics and Gynecology

## 2017-09-22 ENCOUNTER — Ambulatory Visit (INDEPENDENT_AMBULATORY_CARE_PROVIDER_SITE_OTHER): Payer: Medicaid Other | Admitting: *Deleted

## 2017-09-22 ENCOUNTER — Ambulatory Visit (INDEPENDENT_AMBULATORY_CARE_PROVIDER_SITE_OTHER): Payer: Medicaid Other | Admitting: Obstetrics and Gynecology

## 2017-09-22 ENCOUNTER — Encounter: Payer: Medicaid Other | Admitting: Advanced Practice Midwife

## 2017-09-22 ENCOUNTER — Ambulatory Visit: Payer: Self-pay

## 2017-09-22 VITALS — BP 125/80 | HR 102 | Wt 215.1 lb

## 2017-09-22 DIAGNOSIS — Z98891 History of uterine scar from previous surgery: Secondary | ICD-10-CM

## 2017-09-22 DIAGNOSIS — O10919 Unspecified pre-existing hypertension complicating pregnancy, unspecified trimester: Secondary | ICD-10-CM

## 2017-09-22 DIAGNOSIS — O09521 Supervision of elderly multigravida, first trimester: Secondary | ICD-10-CM

## 2017-09-22 DIAGNOSIS — O10913 Unspecified pre-existing hypertension complicating pregnancy, third trimester: Secondary | ICD-10-CM

## 2017-09-22 DIAGNOSIS — O0990 Supervision of high risk pregnancy, unspecified, unspecified trimester: Secondary | ICD-10-CM

## 2017-09-22 LAB — POCT URINALYSIS DIP (DEVICE)
Bilirubin Urine: NEGATIVE
GLUCOSE, UA: NEGATIVE mg/dL
HGB URINE DIPSTICK: NEGATIVE
KETONES UR: NEGATIVE mg/dL
Leukocytes, UA: NEGATIVE
Nitrite: NEGATIVE
Protein, ur: NEGATIVE mg/dL
SPECIFIC GRAVITY, URINE: 1.015 (ref 1.005–1.030)
Urobilinogen, UA: 0.2 mg/dL (ref 0.0–1.0)
pH: 6 (ref 5.0–8.0)

## 2017-09-22 NOTE — Patient Instructions (Signed)
Third Trimester of Pregnancy The third trimester is from week 28 through week 40 (months 7 through 9). The third trimester is a time when the unborn baby (fetus) is growing rapidly. At the end of the ninth month, the fetus is about 20 inches in length and weighs 6-10 pounds. Body changes during your third trimester Your body will continue to go through many changes during pregnancy. The changes vary from woman to woman. During the third trimester:  Your weight will continue to increase. You can expect to gain 25-35 pounds (11-16 kg) by the end of the pregnancy.  You may begin to get stretch marks on your hips, abdomen, and breasts.  You may urinate more often because the fetus is moving lower into your pelvis and pressing on your bladder.  You may develop or continue to have heartburn. This is caused by increased hormones that slow down muscles in the digestive tract.  You may develop or continue to have constipation because increased hormones slow digestion and cause the muscles that push waste through your intestines to relax.  You may develop hemorrhoids. These are swollen veins (varicose veins) in the rectum that can itch or be painful.  You may develop swollen, bulging veins (varicose veins) in your legs.  You may have increased body aches in the pelvis, back, or thighs. This is due to weight gain and increased hormones that are relaxing your joints.  You may have changes in your hair. These can include thickening of your hair, rapid growth, and changes in texture. Some women also have hair loss during or after pregnancy, or hair that feels dry or thin. Your hair will most likely return to normal after your baby is born.  Your breasts will continue to grow and they will continue to become tender. A yellow fluid (colostrum) may leak from your breasts. This is the first milk you are producing for your baby.  Your belly button may stick out.  You may notice more swelling in your hands,  face, or ankles.  You may have increased tingling or numbness in your hands, arms, and legs. The skin on your belly may also feel numb.  You may feel short of breath because of your expanding uterus.  You may have more problems sleeping. This can be caused by the size of your belly, increased need to urinate, and an increase in your body's metabolism.  You may notice the fetus "dropping," or moving lower in your abdomen (lightening).  You may have increased vaginal discharge.  You may notice your joints feel loose and you may have pain around your pelvic bone.  What to expect at prenatal visits You will have prenatal exams every 2 weeks until week 36. Then you will have weekly prenatal exams. During a routine prenatal visit:  You will be weighed to make sure you and the baby are growing normally.  Your blood pressure will be taken.  Your abdomen will be measured to track your baby's growth.  The fetal heartbeat will be listened to.  Any test results from the previous visit will be discussed.  You may have a cervical check near your due date to see if your cervix has softened or thinned (effaced).  You will be tested for Group B streptococcus. This happens between 35 and 37 weeks.  Your health care provider may ask you:  What your birth plan is.  How you are feeling.  If you are feeling the baby move.  If you have had   any abnormal symptoms, such as leaking fluid, bleeding, severe headaches, or abdominal cramping.  If you are using any tobacco products, including cigarettes, chewing tobacco, and electronic cigarettes.  If you have any questions.  Other tests or screenings that may be performed during your third trimester include:  Blood tests that check for low iron levels (anemia).  Fetal testing to check the health, activity level, and growth of the fetus. Testing is done if you have certain medical conditions or if there are problems during the  pregnancy.  Nonstress test (NST). This test checks the health of your baby to make sure there are no signs of problems, such as the baby not getting enough oxygen. During this test, a belt is placed around your belly. The baby is made to move, and its heart rate is monitored during movement.  What is false labor? False labor is a condition in which you feel small, irregular tightenings of the muscles in the womb (contractions) that usually go away with rest, changing position, or drinking water. These are called Braxton Hicks contractions. Contractions may last for hours, days, or even weeks before true labor sets in. If contractions come at regular intervals, become more frequent, increase in intensity, or become painful, you should see your health care provider. What are the signs of labor?  Abdominal cramps.  Regular contractions that start at 10 minutes apart and become stronger and more frequent with time.  Contractions that start on the top of the uterus and spread down to the lower abdomen and back.  Increased pelvic pressure and dull back pain.  A watery or bloody mucus discharge that comes from the vagina.  Leaking of amniotic fluid. This is also known as your "water breaking." It could be a slow trickle or a gush. Let your health care provider know if it has a color or strange odor. If you have any of these signs, call your health care provider right away, even if it is before your due date. Follow these instructions at home: Medicines  Follow your health care provider's instructions regarding medicine use. Specific medicines may be either safe or unsafe to take during pregnancy.  Take a prenatal vitamin that contains at least 600 micrograms (mcg) of folic acid.  If you develop constipation, try taking a stool softener if your health care provider approves. Eating and drinking  Eat a balanced diet that includes fresh fruits and vegetables, whole grains, good sources of protein  such as meat, eggs, or tofu, and low-fat dairy. Your health care provider will help you determine the amount of weight gain that is right for you.  Avoid raw meat and uncooked cheese. These carry germs that can cause birth defects in the baby.  If you have low calcium intake from food, talk to your health care provider about whether you should take a daily calcium supplement.  Eat four or five small meals rather than three large meals a day.  Limit foods that are high in fat and processed sugars, such as fried and sweet foods.  To prevent constipation: ? Drink enough fluid to keep your urine clear or pale yellow. ? Eat foods that are high in fiber, such as fresh fruits and vegetables, whole grains, and beans. Activity  Exercise only as directed by your health care provider. Most women can continue their usual exercise routine during pregnancy. Try to exercise for 30 minutes at least 5 days a week. Stop exercising if you experience uterine contractions.  Avoid heavy   lifting.  Do not exercise in extreme heat or humidity, or at high altitudes.  Wear low-heel, comfortable shoes.  Practice good posture.  You may continue to have sex unless your health care provider tells you otherwise. Relieving pain and discomfort  Take frequent breaks and rest with your legs elevated if you have leg cramps or low back pain.  Take warm sitz baths to soothe any pain or discomfort caused by hemorrhoids. Use hemorrhoid cream if your health care provider approves.  Wear a good support bra to prevent discomfort from breast tenderness.  If you develop varicose veins: ? Wear support pantyhose or compression stockings as told by your healthcare provider. ? Elevate your feet for 15 minutes, 3-4 times a day. Prenatal care  Write down your questions. Take them to your prenatal visits.  Keep all your prenatal visits as told by your health care provider. This is important. Safety  Wear your seat belt at  all times when driving.  Make a list of emergency phone numbers, including numbers for family, friends, the hospital, and police and fire departments. General instructions  Avoid cat litter boxes and soil used by cats. These carry germs that can cause birth defects in the baby. If you have a cat, ask someone to clean the litter box for you.  Do not travel far distances unless it is absolutely necessary and only with the approval of your health care provider.  Do not use hot tubs, steam rooms, or saunas.  Do not drink alcohol.  Do not use any products that contain nicotine or tobacco, such as cigarettes and e-cigarettes. If you need help quitting, ask your health care provider.  Do not use any medicinal herbs or unprescribed drugs. These chemicals affect the formation and growth of the baby.  Do not douche or use tampons or scented sanitary pads.  Do not cross your legs for long periods of time.  To prepare for the arrival of your baby: ? Take prenatal classes to understand, practice, and ask questions about labor and delivery. ? Make a trial run to the hospital. ? Visit the hospital and tour the maternity area. ? Arrange for maternity or paternity leave through employers. ? Arrange for family and friends to take care of pets while you are in the hospital. ? Purchase a rear-facing car seat and make sure you know how to install it in your car. ? Pack your hospital bag. ? Prepare the baby's nursery. Make sure to remove all pillows and stuffed animals from the baby's crib to prevent suffocation.  Visit your dentist if you have not gone during your pregnancy. Use a soft toothbrush to brush your teeth and be gentle when you floss. Contact a health care provider if:  You are unsure if you are in labor or if your water has broken.  You become dizzy.  You have mild pelvic cramps, pelvic pressure, or nagging pain in your abdominal area.  You have lower back pain.  You have persistent  nausea, vomiting, or diarrhea.  You have an unusual or bad smelling vaginal discharge.  You have pain when you urinate. Get help right away if:  Your water breaks before 37 weeks.  You have regular contractions less than 5 minutes apart before 37 weeks.  You have a fever.  You are leaking fluid from your vagina.  You have spotting or bleeding from your vagina.  You have severe abdominal pain or cramping.  You have rapid weight loss or weight gain.    You have shortness of breath with chest pain.  You notice sudden or extreme swelling of your face, hands, ankles, feet, or legs.  Your baby makes fewer than 10 movements in 2 hours.  You have severe headaches that do not go away when you take medicine.  You have vision changes. Summary  The third trimester is from week 28 through week 40, months 7 through 9. The third trimester is a time when the unborn baby (fetus) is growing rapidly.  During the third trimester, your discomfort may increase as you and your baby continue to gain weight. You may have abdominal, leg, and back pain, sleeping problems, and an increased need to urinate.  During the third trimester your breasts will keep growing and they will continue to become tender. A yellow fluid (colostrum) may leak from your breasts. This is the first milk you are producing for your baby.  False labor is a condition in which you feel small, irregular tightenings of the muscles in the womb (contractions) that eventually go away. These are called Braxton Hicks contractions. Contractions may last for hours, days, or even weeks before true labor sets in.  Signs of labor can include: abdominal cramps; regular contractions that start at 10 minutes apart and become stronger and more frequent with time; watery or bloody mucus discharge that comes from the vagina; increased pelvic pressure and dull back pain; and leaking of amniotic fluid. This information is not intended to replace advice  given to you by your health care provider. Make sure you discuss any questions you have with your health care provider. Document Released: 05/21/2001 Document Revised: 11/02/2015 Document Reviewed: 07/28/2012 [employer] Interactive Patient Education  2017 [employer]  

## 2017-09-22 NOTE — Progress Notes (Signed)
Subjective:  Sharon Harvey is a 42 y.o. G2P1001 at [redacted]w[redacted]d being seen today for ongoing prenatal care.  She is currently monitored for the following issues for this high-risk pregnancy and has Supervision of high risk pregnancy, antepartum; History of cesarean section; AMA (advanced maternal age) multigravida 86+, first trimester; and Chronic hypertension affecting pregnancy on their problem list.  Patient reports no complaints.  Contractions: Not present. Vag. Bleeding: None.  Movement: Present. Denies leaking of fluid.   The following portions of the patient's history were reviewed and updated as appropriate: allergies, current medications, past family history, past medical history, past social history, past surgical history and problem list. Problem list updated.  Objective:   Vitals:   09/22/17 0901  BP: 125/80  Pulse: (!) 102  Weight: 215 lb 1.6 oz (97.6 kg)    Fetal Status: Fetal Heart Rate (bpm): 149   Movement: Present     General:  Alert, oriented and cooperative. Patient is in no acute distress.  Skin: Skin is warm and dry. No rash noted.   Cardiovascular: Normal heart rate noted  Respiratory: Normal respiratory effort, no problems with respiration noted  Abdomen: Soft, gravid, appropriate for gestational age. Pain/Pressure: Absent     Pelvic:  Cervical exam deferred        Extremities: Normal range of motion.  Edema: None  Mental Status: Normal mood and affect. Normal behavior. Normal judgment and thought content.   Urinalysis: Urine Protein: Negative Urine Glucose: Negative  Assessment and Plan:  Pregnancy: G2P1001 at [redacted]w[redacted]d  1. AMA (advanced maternal age) multigravida 54+, first trimester Stable  2. Chronic hypertension affecting pregnancy BP stable without meds Continue with BASA No S/Sx of PEC Antenatal testing weekly Growth scan end of the month  3. History of cesarean section Desires TOLAC, consent signed  4. Supervision of high risk pregnancy,  antepartum Stable  Preterm labor symptoms and general obstetric precautions including but not limited to vaginal bleeding, contractions, leaking of fluid and fetal movement were reviewed in detail with the patient. Please refer to After Visit Summary for other counseling recommendations.  Return in about 1 week (around 09/29/2017) for OB visit.   Chancy Milroy, MD

## 2017-09-22 NOTE — Progress Notes (Signed)

## 2017-09-29 ENCOUNTER — Ambulatory Visit (INDEPENDENT_AMBULATORY_CARE_PROVIDER_SITE_OTHER): Payer: Medicaid Other | Admitting: Obstetrics & Gynecology

## 2017-09-29 ENCOUNTER — Ambulatory Visit: Payer: Self-pay

## 2017-09-29 ENCOUNTER — Ambulatory Visit (INDEPENDENT_AMBULATORY_CARE_PROVIDER_SITE_OTHER): Payer: Medicaid Other | Admitting: *Deleted

## 2017-09-29 VITALS — BP 126/78 | HR 102 | Wt 217.1 lb

## 2017-09-29 DIAGNOSIS — O10919 Unspecified pre-existing hypertension complicating pregnancy, unspecified trimester: Secondary | ICD-10-CM

## 2017-09-29 DIAGNOSIS — O09521 Supervision of elderly multigravida, first trimester: Secondary | ICD-10-CM

## 2017-09-29 DIAGNOSIS — O10913 Unspecified pre-existing hypertension complicating pregnancy, third trimester: Secondary | ICD-10-CM

## 2017-09-29 DIAGNOSIS — O09523 Supervision of elderly multigravida, third trimester: Secondary | ICD-10-CM

## 2017-09-29 DIAGNOSIS — Z98891 History of uterine scar from previous surgery: Secondary | ICD-10-CM

## 2017-09-29 DIAGNOSIS — O0990 Supervision of high risk pregnancy, unspecified, unspecified trimester: Secondary | ICD-10-CM

## 2017-09-29 DIAGNOSIS — O0993 Supervision of high risk pregnancy, unspecified, third trimester: Secondary | ICD-10-CM

## 2017-09-29 NOTE — Progress Notes (Signed)
   PRENATAL VISIT NOTE  Subjective:  Sharon Harvey is a 42 y.o. married G2P1001 at [redacted]w[redacted]d being seen today for ongoing prenatal care.  She is currently monitored for the following issues for this high-risk pregnancy and has Supervision of high risk pregnancy, antepartum; History of cesarean section; AMA (advanced maternal age) multigravida 88+, first trimester; and Chronic hypertension affecting pregnancy on their problem list.  Patient reports no complaints.  Contractions: Not present. Vag. Bleeding: None.  Movement: Present. Denies leaking of fluid.   The following portions of the patient's history were reviewed and updated as appropriate: allergies, current medications, past family history, past medical history, past social history, past surgical history and problem list. Problem list updated.  Objective:   Vitals:   09/29/17 1049  BP: 126/78  Pulse: (!) 102  Weight: 217 lb 1.6 oz (98.5 kg)    Fetal Status: Fetal Heart Rate (bpm): NST   Movement: Present     General:  Alert, oriented and cooperative. Patient is in no acute distress.  Skin: Skin is warm and dry. No rash noted.   Cardiovascular: Normal heart rate noted  Respiratory: Normal respiratory effort, no problems with respiration noted  Abdomen: Soft, gravid, appropriate for gestational age.  Pain/Pressure: Absent     Pelvic: Cervical exam deferred        Extremities: Normal range of motion.  Edema: None  Mental Status: Normal mood and affect. Normal behavior. Normal judgment and thought content.   Assessment and Plan:  Pregnancy: G2P1001 at [redacted]w[redacted]d  1. Supervision of high risk pregnancy, antepartum - continue weekly testing  2. Chronic hypertension affecting pregnancy - on baby asa  3. History of cesarean section - wants TOLAC, signed consent  4. AMA (advanced maternal age) multigravida 67+, first trimester   Preterm labor symptoms and general obstetric precautions including but not limited to vaginal bleeding,  contractions, leaking of fluid and fetal movement were reviewed in detail with the patient. Please refer to After Visit Summary for other counseling recommendations.  No follow-ups on file.  Future Appointments  Date Time Provider [place]  10/06/2017  8:15 AM WOC-WOCA NST WOC-WOCA WOC  10/06/2017  9:15 AM Emily Filbert, MD WOC-WOCA WOC  10/06/2017 11:00 AM WH-MFC [place] 3 WH-MFCUS MFC-US  10/13/2017 10:15 AM WOC-WOCA NST WOC-WOCA WOC  10/13/2017 11:15 AM Emily Filbert, MD [place] WOC  10/20/2017  8:15 AM WOC-WOCA NST WOC-WOCA WOC  10/20/2017  9:15 AM Aletha Halim, MD WOC-WOCA WOC  10/27/2017  8:15 AM WOC-WOCA NST WOC-WOCA WOC  10/27/2017  9:15 AM Donnamae Jude, MD WOC-WOCA WOC    Emily Filbert, MD

## 2017-09-29 NOTE — Progress Notes (Signed)

## 2017-09-29 NOTE — Addendum Note (Signed)
Addended by: Langston Reusing on: 09/29/2017 01:10 PM   Modules accepted: Orders

## 2017-09-30 NOTE — Progress Notes (Signed)
I have reviewed this chart and agree with the RN/CMA assessment and management.    Analiz Tvedt C Harrell Niehoff, MD, FACOG Attending Physician, Faculty Practice [facility]  

## 2017-10-06 ENCOUNTER — Ambulatory Visit (INDEPENDENT_AMBULATORY_CARE_PROVIDER_SITE_OTHER): Payer: Medicaid Other | Admitting: *Deleted

## 2017-10-06 ENCOUNTER — Ambulatory Visit (INDEPENDENT_AMBULATORY_CARE_PROVIDER_SITE_OTHER): Payer: Medicaid Other | Admitting: Obstetrics & Gynecology

## 2017-10-06 ENCOUNTER — Other Ambulatory Visit: Payer: Self-pay | Admitting: Obstetrics & Gynecology

## 2017-10-06 ENCOUNTER — Ambulatory Visit (HOSPITAL_COMMUNITY)
Admission: RE | Admit: 2017-10-06 | Discharge: 2017-10-06 | Disposition: A | Discharge status: Home / Self Care | Payer: Medicaid Other | Source: Ambulatory Visit | Attending: Family Medicine | Admitting: Family Medicine

## 2017-10-06 ENCOUNTER — Encounter (HOSPITAL_COMMUNITY): Payer: Self-pay

## 2017-10-06 VITALS — BP 138/88 | HR 98 | Wt 216.8 lb

## 2017-10-06 DIAGNOSIS — Z3A34 34 weeks gestation of pregnancy: Secondary | ICD-10-CM

## 2017-10-06 DIAGNOSIS — O10919 Unspecified pre-existing hypertension complicating pregnancy, unspecified trimester: Secondary | ICD-10-CM

## 2017-10-06 DIAGNOSIS — Z98891 History of uterine scar from previous surgery: Secondary | ICD-10-CM

## 2017-10-06 DIAGNOSIS — O10913 Unspecified pre-existing hypertension complicating pregnancy, third trimester: Secondary | ICD-10-CM

## 2017-10-06 DIAGNOSIS — O09521 Supervision of elderly multigravida, first trimester: Secondary | ICD-10-CM

## 2017-10-06 DIAGNOSIS — O09523 Supervision of elderly multigravida, third trimester: Secondary | ICD-10-CM

## 2017-10-06 DIAGNOSIS — O0990 Supervision of high risk pregnancy, unspecified, unspecified trimester: Secondary | ICD-10-CM

## 2017-10-06 DIAGNOSIS — O34219 Maternal care for unspecified type scar from previous cesarean delivery: Secondary | ICD-10-CM

## 2017-10-06 DIAGNOSIS — O0993 Supervision of high risk pregnancy, unspecified, third trimester: Secondary | ICD-10-CM

## 2017-10-06 NOTE — Progress Notes (Signed)
   PRENATAL VISIT NOTE  Subjective:  Sharon Harvey is a 42 y.o. G2P1001 at [redacted]w[redacted]d being seen today for ongoing prenatal care.  She is currently monitored for the following issues for this high-risk pregnancy and has Supervision of high risk pregnancy, antepartum; History of cesarean section; AMA (advanced maternal age) multigravida 55+, first trimester; and Chronic hypertension affecting pregnancy on their problem list.  Patient reports no complaints.  Contractions: Not present. Vag. Bleeding: None.  Movement: Present. Denies leaking of fluid.   The following portions of the patient's history were reviewed and updated as appropriate: allergies, current medications, past family history, past medical history, past social history, past surgical history and problem list. Problem list updated.  Objective:   Vitals:   10/06/17 0911  BP: 138/88  Pulse: 98  Weight: 216 lb 12.8 oz (98.3 kg)    Fetal Status: Fetal Heart Rate (bpm): 152   Movement: Present     General:  Alert, oriented and cooperative. Patient is in no acute distress.  Skin: Skin is warm and dry. No rash noted.   Cardiovascular: Normal heart rate noted  Respiratory: Normal respiratory effort, no problems with respiration noted  Abdomen: Soft, gravid, appropriate for gestational age.  Pain/Pressure: Absent     Pelvic: Cervical exam deferred        Extremities: Normal range of motion.  Edema: None  Mental Status: Normal mood and affect. Normal behavior. Normal judgment and thought content.   Assessment and Plan:  Pregnancy: G2P1001 at [redacted]w[redacted]d  1. Supervision of high risk pregnancy, antepartum   2. Chronic hypertension affecting pregnancy - continue weekly testing - MFM u/s today  3. History of cesarean section - plans TOLAC  4. AMA (advanced maternal age) multigravida 29+, first trimester   Preterm labor symptoms and general obstetric precautions including but not limited to vaginal bleeding, contractions, leaking of  fluid and fetal movement were reviewed in detail with the patient. Please refer to After Visit Summary for other counseling recommendations.  Return in about 1 week (around 10/13/2017) for weekly as scheduled.  Future Appointments  Date Time Provider [place]  10/06/2017 11:00 AM WH-MFC [place] 3 WH-MFCUS MFC-US  10/13/2017 10:15 AM WOC-WOCA NST WOC-WOCA WOC  10/13/2017 11:15 AM Emily Filbert, MD WOC-WOCA WOC  10/20/2017  8:15 AM WOC-WOCA NST WOC-WOCA WOC  10/20/2017  9:15 AM Aletha Halim, MD WOC-WOCA WOC  10/27/2017  8:15 AM WOC-WOCA NST WOC-WOCA WOC  10/27/2017  9:15 AM Donnamae Jude, MD WOC-WOCA WOC    Emily Filbert, MD

## 2017-10-13 ENCOUNTER — Other Ambulatory Visit (HOSPITAL_COMMUNITY)
Admission: RE | Admit: 2017-10-13 | Discharge: 2017-10-13 | Disposition: A | Discharge status: Home / Self Care | Payer: Medicaid Other | Source: Ambulatory Visit | Attending: Obstetrics & Gynecology | Admitting: Obstetrics & Gynecology

## 2017-10-13 ENCOUNTER — Ambulatory Visit (INDEPENDENT_AMBULATORY_CARE_PROVIDER_SITE_OTHER): Payer: Medicaid Other | Admitting: Obstetrics & Gynecology

## 2017-10-13 ENCOUNTER — Ambulatory Visit (INDEPENDENT_AMBULATORY_CARE_PROVIDER_SITE_OTHER): Payer: Medicaid Other | Admitting: *Deleted

## 2017-10-13 ENCOUNTER — Ambulatory Visit: Payer: Self-pay

## 2017-10-13 VITALS — BP 135/74 | HR 111 | Wt 218.7 lb

## 2017-10-13 DIAGNOSIS — Z98891 History of uterine scar from previous surgery: Secondary | ICD-10-CM

## 2017-10-13 DIAGNOSIS — O10919 Unspecified pre-existing hypertension complicating pregnancy, unspecified trimester: Secondary | ICD-10-CM

## 2017-10-13 DIAGNOSIS — O0990 Supervision of high risk pregnancy, unspecified, unspecified trimester: Secondary | ICD-10-CM

## 2017-10-13 DIAGNOSIS — O10913 Unspecified pre-existing hypertension complicating pregnancy, third trimester: Secondary | ICD-10-CM

## 2017-10-13 DIAGNOSIS — Z3A35 35 weeks gestation of pregnancy: Secondary | ICD-10-CM | POA: Diagnosis not present

## 2017-10-13 DIAGNOSIS — O09523 Supervision of elderly multigravida, third trimester: Secondary | ICD-10-CM

## 2017-10-13 DIAGNOSIS — O0993 Supervision of high risk pregnancy, unspecified, third trimester: Secondary | ICD-10-CM | POA: Diagnosis present

## 2017-10-13 LAB — OB RESULTS CONSOLE GC/CHLAMYDIA: Gonorrhea: NEGATIVE

## 2017-10-13 LAB — OB RESULTS CONSOLE GBS: STREP GROUP B AG: NEGATIVE

## 2017-10-13 NOTE — Progress Notes (Signed)
   PRENATAL VISIT NOTE  Subjective:  Sharon Harvey is a 42 y.o. married G2P1001 at [redacted]w[redacted]d being seen today for ongoing prenatal care.  She is currently monitored for the following issues for this high-risk pregnancy and has Supervision of high risk pregnancy, antepartum; History of cesarean section; AMA (advanced maternal age) multigravida 31+, first trimester; and Chronic hypertension affecting pregnancy on their problem list.  Patient reports no complaints.  Contractions: Not present. Vag. Bleeding: None.  Movement: Present. Denies leaking of fluid.   The following portions of the patient's history were reviewed and updated as appropriate: allergies, current medications, past family history, past medical history, past social history, past surgical history and problem list. Problem list updated.  Objective:   Vitals:   10/13/17 1100  BP: 135/74  Pulse: (!) 111  Weight: 218 lb 11.2 oz (99.2 kg)    Fetal Status: Fetal Heart Rate (bpm): NST   Movement: Present     General:  Alert, oriented and cooperative. Patient is in no acute distress.  Skin: Skin is warm and dry. No rash noted.   Cardiovascular: Normal heart rate noted  Respiratory: Normal respiratory effort, no problems with respiration noted  Abdomen: Soft, gravid, appropriate for gestational age.  Pain/Pressure: Present     Pelvic: Cervical exam performed        Extremities: Normal range of motion.  Edema: None  Mental Status: Normal mood and affect. Normal behavior. Normal judgment and thought content.   Assessment and Plan:  Pregnancy: G2P1001 at [redacted]w[redacted]d  1. Supervision of high risk pregnancy, antepartum  - GC/Chlamydia probe amp ([place])not at [facility] - Strep Gp B NAA  2. AMA (advanced maternal age) multigravida 15+, third trimester   3. Chronic hypertension affecting pregnancy   4. History of cesarean section - She would like a TOLAC, has signed consent 08/25/17  Preterm labor symptoms and general obstetric  precautions including but not limited to vaginal bleeding, contractions, leaking of fluid and fetal movement were reviewed in detail with the patient. Please refer to After Visit Summary for other counseling recommendations.  Return in about 1 week (around 10/20/2017) for as scheduled.  Future Appointments  Date Time Provider [place]  10/20/2017  8:15 AM WOC-WOCA NST [place] WOC  10/20/2017  9:15 AM Aletha Halim, MD [facility] WOC  10/27/2017  8:15 AM WOC-WOCA NST WOC-WOCA WOC  10/27/2017  9:15 AM Donnamae Jude, MD WOC-WOCA WOC    Emily Filbert, MD

## 2017-10-13 NOTE — Progress Notes (Signed)

## 2017-10-13 NOTE — Progress Notes (Signed)
[place] growth and BPP done 4/29

## 2017-10-13 NOTE — Progress Notes (Signed)
I have reviewed this chart and agree with the RN/CMA assessment and management.    Kaiyan Luczak C Maceo Hernan, MD, FACOG Attending Physician, Faculty Practice [facility]  

## 2017-10-14 LAB — GC/CHLAMYDIA PROBE AMP (~~LOC~~) NOT AT ARMC
Chlamydia: NEGATIVE
NEISSERIA GONORRHEA: NEGATIVE

## 2017-10-15 LAB — STREP GP B NAA: STREP GROUP B AG: NEGATIVE

## 2017-10-20 ENCOUNTER — Ambulatory Visit: Payer: Self-pay

## 2017-10-20 ENCOUNTER — Ambulatory Visit (INDEPENDENT_AMBULATORY_CARE_PROVIDER_SITE_OTHER): Payer: Medicaid Other | Admitting: Obstetrics and Gynecology

## 2017-10-20 ENCOUNTER — Ambulatory Visit (INDEPENDENT_AMBULATORY_CARE_PROVIDER_SITE_OTHER): Payer: Medicaid Other | Admitting: *Deleted

## 2017-10-20 VITALS — BP 135/80 | HR 103 | Wt 219.9 lb

## 2017-10-20 DIAGNOSIS — O09523 Supervision of elderly multigravida, third trimester: Secondary | ICD-10-CM

## 2017-10-20 DIAGNOSIS — Z98891 History of uterine scar from previous surgery: Secondary | ICD-10-CM

## 2017-10-20 DIAGNOSIS — O320XX0 Maternal care for unstable lie, not applicable or unspecified: Secondary | ICD-10-CM | POA: Insufficient documentation

## 2017-10-20 DIAGNOSIS — O10913 Unspecified pre-existing hypertension complicating pregnancy, third trimester: Secondary | ICD-10-CM | POA: Diagnosis not present

## 2017-10-20 DIAGNOSIS — O10919 Unspecified pre-existing hypertension complicating pregnancy, unspecified trimester: Secondary | ICD-10-CM

## 2017-10-20 DIAGNOSIS — O0990 Supervision of high risk pregnancy, unspecified, unspecified trimester: Secondary | ICD-10-CM

## 2017-10-20 DIAGNOSIS — O09521 Supervision of elderly multigravida, first trimester: Secondary | ICD-10-CM

## 2017-10-20 NOTE — Progress Notes (Signed)
Prenatal Visit Note Date: 10/20/2017 Clinic: [facility]  Subjective:  Sharon Harvey is a 42 y.o. G2P1001 at [redacted]w[redacted]d being seen today for ongoing prenatal care.  She is currently monitored for the following issues for this high-risk pregnancy and has Supervision of high risk pregnancy, antepartum; History of cesarean section; AMA (advanced maternal age) multigravida 23+, first trimester; Chronic hypertension affecting pregnancy; and Unstable lie of fetus, antepartum on their problem list.  Patient reports no complaints.   Contractions: Regular. Vag. Bleeding: None.  Movement: Present. Denies leaking of fluid.   The following portions of the patient's history were reviewed and updated as appropriate: allergies, current medications, past family history, past medical history, past social history, past surgical history and problem list. Problem list updated.  Objective:   Vitals:   10/20/17 0903  BP: 135/80  Pulse: (!) 103  Weight: 219 lb 14.4 oz (99.7 kg)    Fetal Status: Fetal Heart Rate (bpm): NST   Movement: Present     General:  Alert, oriented and cooperative. Patient is in no acute distress.  Skin: Skin is warm and dry. No rash noted.   Cardiovascular: Normal heart rate noted  Respiratory: Normal respiratory effort, no problems with respiration noted  Abdomen: Soft, gravid, appropriate for gestational age. Pain/Pressure: Present     Pelvic:  Cervical exam deferred        Extremities: Normal range of motion.  Edema: None  Mental Status: Normal mood and affect. Normal behavior. Normal judgment and thought content.   Urinalysis:      Assessment and Plan:  Pregnancy: G2P1001 at 102w4d  1. Supervision of high risk pregnancy, antepartum Routine care. D/w them re: BTL and might be interested in BTL but hasn't signed papers will f/u w/ them nv but said it would have to be an interval one.  - [place] MFM OB FOLLOW UP; Future - [place] MFM FETAL BPP WO NON STRESS;  Future  2. AMA (advanced maternal age) multigravida 71+, third trimester See below  3. Chronic hypertension affecting pregnancy Doing well on no meds. bpp 10/10 with 140 baseilne, +accels, no decel, mod variability, toco quiet x 62m. Repeat growth in two weeks - [place] MFM OB FOLLOW UP; Future - [place] MFM FETAL BPP WO NON STRESS; Future  4. History of cesarean section Desires tolac. Had 2013 c/s in [place] b/c the doctors were going on strike and they wanted to deliver her before then. Never told she couldn't labor in the future.   5. Unstable fetal lie, antepartum, single or unspecified fetus Breech, today, cephalic last week and breech the week before. D/w her re: ECV and options such as ecv next week if still breech vs waiting until delivery at 39wks to consider ECV. Risks of ECV also d/w her. Will f/u nv.   Preterm labor symptoms and general obstetric precautions including but not limited to vaginal bleeding, contractions, leaking of fluid and fetal movement were reviewed in detail with the patient. Please refer to After Visit Summary for other counseling recommendations.  Return in about 1 week (around 10/27/2017) for as scheduled; 5/28 NST/BPP and HOB.   Aletha Halim, MD

## 2017-10-20 NOTE — Progress Notes (Signed)
I have reviewed the chart and agree with nursing staff's documentation of this patient's encounter.  Aletha Halim, MD 10/20/2017 2:00 PM

## 2017-10-20 NOTE — Progress Notes (Signed)

## 2017-10-20 NOTE — Progress Notes (Signed)
Pt reports having itching of stomach and back x1 week. She denies itching of feet and hands.

## 2017-10-27 ENCOUNTER — Ambulatory Visit: Payer: Self-pay

## 2017-10-27 ENCOUNTER — Ambulatory Visit (INDEPENDENT_AMBULATORY_CARE_PROVIDER_SITE_OTHER): Payer: Medicaid Other | Admitting: Family Medicine

## 2017-10-27 ENCOUNTER — Ambulatory Visit (INDEPENDENT_AMBULATORY_CARE_PROVIDER_SITE_OTHER): Payer: Medicaid Other | Admitting: *Deleted

## 2017-10-27 VITALS — BP 131/76 | HR 90 | Wt 220.0 lb

## 2017-10-27 DIAGNOSIS — O09523 Supervision of elderly multigravida, third trimester: Secondary | ICD-10-CM

## 2017-10-27 DIAGNOSIS — O10913 Unspecified pre-existing hypertension complicating pregnancy, third trimester: Secondary | ICD-10-CM | POA: Diagnosis not present

## 2017-10-27 DIAGNOSIS — O10919 Unspecified pre-existing hypertension complicating pregnancy, unspecified trimester: Secondary | ICD-10-CM

## 2017-10-27 DIAGNOSIS — O0993 Supervision of high risk pregnancy, unspecified, third trimester: Secondary | ICD-10-CM

## 2017-10-27 DIAGNOSIS — O09521 Supervision of elderly multigravida, first trimester: Secondary | ICD-10-CM

## 2017-10-27 DIAGNOSIS — Z98891 History of uterine scar from previous surgery: Secondary | ICD-10-CM

## 2017-10-27 DIAGNOSIS — O0990 Supervision of high risk pregnancy, unspecified, unspecified trimester: Secondary | ICD-10-CM

## 2017-10-27 DIAGNOSIS — O34219 Maternal care for unspecified type scar from previous cesarean delivery: Secondary | ICD-10-CM

## 2017-10-27 NOTE — Progress Notes (Signed)
   PRENATAL VISIT NOTE  Subjective:  Sharon Harvey is a 42 y.o. G2P1001 at [redacted]w[redacted]d being seen today for ongoing prenatal care.  She is currently monitored for the following issues for this high-risk pregnancy and has Supervision of high risk pregnancy, antepartum; History of cesarean section; AMA (advanced maternal age) multigravida 41+, first trimester; Chronic hypertension affecting pregnancy; and Unstable lie of fetus, antepartum on their problem list.  Patient reports no complaints.  Contractions: Irregular. Vag. Bleeding: None.  Movement: Present. Denies leaking of fluid.   The following portions of the patient's history were reviewed and updated as appropriate: allergies, current medications, past family history, past medical history, past social history, past surgical history and problem list. Problem list updated.  Objective:   Vitals:   10/27/17 0907 10/27/17 0923  BP: 140/81 131/76  Pulse: 90   Weight: 220 lb (99.8 kg)     Fetal Status: Fetal Heart Rate (bpm): NST   Movement: Present  Presentation: Vertex  General:  Alert, oriented and cooperative. Patient is in no acute distress.  Skin: Skin is warm and dry. No rash noted.   Cardiovascular: Normal heart rate noted  Respiratory: Normal respiratory effort, no problems with respiration noted  Abdomen: Soft, gravid, appropriate for gestational age.  Pain/Pressure: Present     Pelvic: Cervical exam deferred        Extremities: Normal range of motion.  Edema: Mild pitting, slight indentation  Mental Status: Normal mood and affect. Normal behavior. Normal judgment and thought content.  NST:  Baseline: 140 bpm, Variability: Good {> 6 bpm), Accelerations: Reactive and Decelerations: Absent   Assessment and Plan:  Pregnancy: G2P1001 at [redacted]w[redacted]d  1. Supervision of high risk pregnancy, antepartum   2. AMA (advanced maternal age) multigravida 87+, third trimester Declined genetic testing Normal growth and fetal testing  3. Chronic  hypertension affecting pregnancy BP well controlled  4. History of cesarean section Desires TOLAC    Preterm labor symptoms and general obstetric precautions including but not limited to vaginal bleeding, contractions, leaking of fluid and fetal movement were reviewed in detail with the patient. Please refer to After Visit Summary for other counseling recommendations.  Return in about 8 days (around 11/04/2017) for as scheduled.  Future Appointments  Date Time Provider [place]  10/31/2017  8:30 AM WH-MFC [place] 1 WH-MFCUS MFC-US  11/04/2017 10:15 AM Donnamae Jude, MD WOC-WOCA WOC  11/04/2017 11:15 AM WOC-WOCA NST WOC-WOCA WOC    Donnamae Jude, MD

## 2017-10-27 NOTE — Progress Notes (Signed)

## 2017-10-27 NOTE — Progress Notes (Signed)
[place] for growth scheduled on 5/24.  Pt had slight H/A yesterday - lasted 30 minutes. She denies visual disturbances.

## 2017-10-27 NOTE — Patient Instructions (Signed)

## 2017-10-31 ENCOUNTER — Ambulatory Visit (HOSPITAL_COMMUNITY)
Admission: RE | Admit: 2017-10-31 | Discharge: 2017-10-31 | Disposition: A | Discharge status: Home / Self Care | Payer: Medicaid Other | Source: Ambulatory Visit | Attending: Obstetrics and Gynecology | Admitting: Obstetrics and Gynecology

## 2017-10-31 ENCOUNTER — Encounter (HOSPITAL_COMMUNITY): Payer: Self-pay

## 2017-10-31 DIAGNOSIS — Z362 Encounter for other antenatal screening follow-up: Secondary | ICD-10-CM | POA: Diagnosis not present

## 2017-10-31 DIAGNOSIS — Z3A38 38 weeks gestation of pregnancy: Secondary | ICD-10-CM | POA: Diagnosis not present

## 2017-10-31 DIAGNOSIS — O09523 Supervision of elderly multigravida, third trimester: Secondary | ICD-10-CM | POA: Insufficient documentation

## 2017-10-31 DIAGNOSIS — O0990 Supervision of high risk pregnancy, unspecified, unspecified trimester: Secondary | ICD-10-CM

## 2017-10-31 DIAGNOSIS — O10013 Pre-existing essential hypertension complicating pregnancy, third trimester: Secondary | ICD-10-CM | POA: Diagnosis not present

## 2017-10-31 DIAGNOSIS — O34219 Maternal care for unspecified type scar from previous cesarean delivery: Secondary | ICD-10-CM | POA: Diagnosis not present

## 2017-10-31 DIAGNOSIS — O10919 Unspecified pre-existing hypertension complicating pregnancy, unspecified trimester: Secondary | ICD-10-CM

## 2017-11-02 ENCOUNTER — Inpatient Hospital Stay (HOSPITAL_COMMUNITY)
Admission: AD | Admit: 2017-11-02 | Discharge: 2017-11-05 | DRG: 807 | Disposition: A | Discharge status: Home / Self Care | Payer: Medicaid Other | Source: Ambulatory Visit | Attending: Obstetrics and Gynecology | Admitting: Obstetrics and Gynecology

## 2017-11-02 ENCOUNTER — Other Ambulatory Visit: Payer: Self-pay

## 2017-11-02 ENCOUNTER — Encounter (HOSPITAL_COMMUNITY): Payer: Self-pay

## 2017-11-02 ENCOUNTER — Inpatient Hospital Stay (HOSPITAL_COMMUNITY)
Admission: AD | Admit: 2017-11-02 | Discharge: 2017-11-02 | Disposition: A | Discharge status: Home / Self Care | Payer: Medicaid Other | Source: Ambulatory Visit | Attending: Obstetrics & Gynecology | Admitting: Obstetrics & Gynecology

## 2017-11-02 DIAGNOSIS — Z98891 History of uterine scar from previous surgery: Secondary | ICD-10-CM

## 2017-11-02 DIAGNOSIS — O34219 Maternal care for unspecified type scar from previous cesarean delivery: Secondary | ICD-10-CM | POA: Diagnosis present

## 2017-11-02 DIAGNOSIS — O1420 HELLP syndrome (HELLP), unspecified trimester: Secondary | ICD-10-CM

## 2017-11-02 DIAGNOSIS — O1002 Pre-existing essential hypertension complicating childbirth: Secondary | ICD-10-CM | POA: Diagnosis present

## 2017-11-02 DIAGNOSIS — Z3A38 38 weeks gestation of pregnancy: Secondary | ICD-10-CM

## 2017-11-02 DIAGNOSIS — O479 False labor, unspecified: Secondary | ICD-10-CM | POA: Diagnosis present

## 2017-11-02 DIAGNOSIS — D649 Anemia, unspecified: Secondary | ICD-10-CM | POA: Diagnosis not present

## 2017-11-02 DIAGNOSIS — O1424 HELLP syndrome, complicating childbirth: Principal | ICD-10-CM | POA: Diagnosis present

## 2017-11-02 DIAGNOSIS — O0990 Supervision of high risk pregnancy, unspecified, unspecified trimester: Secondary | ICD-10-CM

## 2017-11-02 DIAGNOSIS — O10919 Unspecified pre-existing hypertension complicating pregnancy, unspecified trimester: Secondary | ICD-10-CM | POA: Diagnosis present

## 2017-11-02 HISTORY — DX: Essential (primary) hypertension: I10

## 2017-11-02 LAB — URINALYSIS, ROUTINE W REFLEX MICROSCOPIC
BILIRUBIN URINE: NEGATIVE
GLUCOSE, UA: NEGATIVE mg/dL
HGB URINE DIPSTICK: NEGATIVE
KETONES UR: NEGATIVE mg/dL
Leukocytes, UA: NEGATIVE
Nitrite: NEGATIVE
PH: 7 (ref 5.0–8.0)
PROTEIN: NEGATIVE mg/dL
Specific Gravity, Urine: 1.004 — ABNORMAL LOW (ref 1.005–1.030)

## 2017-11-02 NOTE — Progress Notes (Addendum)
G2P1 @ 38.[redacted] wksga. Here for ctx that started an hr ago. Denies LOF or bleeding. +FM  VE: ft/high/ballotable  Noted high bp. Serial bp started.   Hx: previous pc/s for unknown reason in [place]. Pt states bikini cut.   1601: Provider notified. Report status of pt given. Orders received to walk pt for an hour and recheck cervix.   1610: Pt sent walking.   1715: back from walking. VE: 1/thick/ballotable   Provider notified. Report given.   1757: d/c instructions given with pt understanding. Pt left unit via ambulatory with so

## 2017-11-02 NOTE — Discharge Instructions (Signed)
Braxton Hicks Contractions °Contractions of the uterus can occur throughout pregnancy, but they are not always a sign that you are in labor. You may have practice contractions called Braxton Hicks contractions. These false labor contractions are sometimes confused with true labor. °What are Braxton Hicks contractions? °Braxton Hicks contractions are tightening movements that occur in the muscles of the uterus before labor. Unlike true labor contractions, these contractions do not result in opening (dilation) and thinning of the cervix. Toward the end of pregnancy (32-34 weeks), Braxton Hicks contractions can happen more often and may become stronger. These contractions are sometimes difficult to tell apart from true labor because they can be very uncomfortable. You should not feel embarrassed if you go to the hospital with false labor. °Sometimes, the only way to tell if you are in true labor is for your health care provider to look for changes in the cervix. The health care provider will do a physical exam and may monitor your contractions. If you are not in true labor, the exam should show that your cervix is not dilating and your water has not broken. °If there are other health problems associated with your pregnancy, it is completely safe for you to be sent home with false labor. You may continue to have Braxton Hicks contractions until you go into true labor. °How to tell the difference between true labor and false labor °True labor °· Contractions last 30-70 seconds. °· Contractions become very regular. °· Discomfort is usually felt in the top of the uterus, and it spreads to the lower abdomen and low back. °· Contractions do not go away with walking. °· Contractions usually become more intense and increase in frequency. °· The cervix dilates and gets thinner. °False labor °· Contractions are usually shorter and not as strong as true labor contractions. °· Contractions are usually irregular. °· Contractions  are often felt in the front of the lower abdomen and in the groin. °· Contractions may go away when you walk around or change positions while lying down. °· Contractions get weaker and are shorter-lasting as time goes on. °· The cervix usually does not dilate or become thin. °Follow these instructions at home: °· Take over-the-counter and prescription medicines only as told by your health care provider. °· Keep up with your usual exercises and follow other instructions from your health care provider. °· Eat and drink lightly if you think you are going into labor. °· If Braxton Hicks contractions are making you uncomfortable: °? Change your position from lying down or resting to walking, or change from walking to resting. °? Sit and rest in a tub of warm water. °? Drink enough fluid to keep your urine pale yellow. Dehydration may cause these contractions. °? Do slow and deep breathing several times an hour. °· Keep all follow-up prenatal visits as told by your health care provider. This is important. °Contact a health care provider if: °· You have a fever. °· You have continuous pain in your abdomen. °Get help right away if: °· Your contractions become stronger, more regular, and closer together. °· You have fluid leaking or gushing from your vagina. °· You pass blood-tinged mucus (bloody show). °· You have bleeding from your vagina. °· You have low back pain that you never had before. °· You feel your baby’s head pushing down and causing pelvic pressure. °· Your baby is not moving inside you as much as it used to. °Summary °· Contractions that occur before labor are called Braxton   Hicks contractions, false labor, or practice contractions. °· Braxton Hicks contractions are usually shorter, weaker, farther apart, and less regular than true labor contractions. True labor contractions usually become progressively stronger and regular and they become more frequent. °· Manage discomfort from Braxton Hicks contractions by  changing position, resting in a warm bath, drinking plenty of water, or practicing deep breathing. °This information is not intended to replace advice given to you by your health care provider. Make sure you discuss any questions you have with your health care provider. °Document Released: 10/10/2016 Document Revised: 10/10/2016 Document Reviewed: 10/10/2016 °Elsevier Interactive Patient Education © 2018 [employer] ° °

## 2017-11-03 ENCOUNTER — Inpatient Hospital Stay (HOSPITAL_COMMUNITY): Payer: Medicaid Other | Admitting: Anesthesiology

## 2017-11-03 ENCOUNTER — Other Ambulatory Visit: Payer: Self-pay

## 2017-11-03 ENCOUNTER — Encounter (HOSPITAL_COMMUNITY): Payer: Self-pay

## 2017-11-03 DIAGNOSIS — Z3A38 38 weeks gestation of pregnancy: Secondary | ICD-10-CM

## 2017-11-03 DIAGNOSIS — O1002 Pre-existing essential hypertension complicating childbirth: Secondary | ICD-10-CM | POA: Diagnosis not present

## 2017-11-03 DIAGNOSIS — O1420 HELLP syndrome (HELLP), unspecified trimester: Secondary | ICD-10-CM

## 2017-11-03 DIAGNOSIS — O34219 Maternal care for unspecified type scar from previous cesarean delivery: Secondary | ICD-10-CM | POA: Diagnosis present

## 2017-11-03 DIAGNOSIS — O479 False labor, unspecified: Secondary | ICD-10-CM | POA: Diagnosis present

## 2017-11-03 DIAGNOSIS — O1424 HELLP syndrome, complicating childbirth: Secondary | ICD-10-CM | POA: Diagnosis present

## 2017-11-03 DIAGNOSIS — Z3483 Encounter for supervision of other normal pregnancy, third trimester: Secondary | ICD-10-CM | POA: Diagnosis present

## 2017-11-03 LAB — PROTEIN / CREATININE RATIO, URINE
Creatinine, Urine: 33 mg/dL
Total Protein, Urine: <6 mg/dL

## 2017-11-03 LAB — CBC
HCT: 28.1 % — ABNORMAL LOW (ref 36.0–46.0)
HEMATOCRIT: 34.2 % — AB (ref 36.0–46.0)
HEMATOCRIT: 37.1 % (ref 36.0–46.0)
HEMOGLOBIN: 11.2 g/dL — AB (ref 12.0–15.0)
HEMOGLOBIN: 12.1 g/dL (ref 12.0–15.0)
HEMOGLOBIN: 9.1 g/dL — AB (ref 12.0–15.0)
MCH: 23.4 pg — ABNORMAL LOW (ref 26.0–34.0)
MCH: 23.5 pg — ABNORMAL LOW (ref 26.0–34.0)
MCH: 23.1 pg — AB (ref 26.0–34.0)
MCHC: 32.6 g/dL (ref 30.0–36.0)
MCHC: 32.7 g/dL (ref 30.0–36.0)
MCHC: 32.4 g/dL (ref 30.0–36.0)
MCV: 71.6 fL — ABNORMAL LOW (ref 78.0–100.0)
MCV: 71.7 fL — ABNORMAL LOW (ref 78.0–100.0)
MCV: 71.3 fL — AB (ref 78.0–100.0)
Platelets: 106 10*3/uL — ABNORMAL LOW (ref 150–400)
Platelets: 107 10*3/uL — ABNORMAL LOW (ref 150–400)
Platelets: 102 10*3/uL — ABNORMAL LOW (ref 150–400)
RBC: 3.94 MIL/uL (ref 3.87–5.11)
RBC: 4.77 MIL/uL (ref 3.87–5.11)
RBC: 5.18 MIL/uL — AB (ref 3.87–5.11)
RDW: 17.7 % — ABNORMAL HIGH (ref 11.5–15.5)
RDW: 17.7 % — ABNORMAL HIGH (ref 11.5–15.5)
RDW: 18 % — AB (ref 11.5–15.5)
WBC: 11.4 10*3/uL — AB (ref 4.0–10.5)
WBC: 12.4 10*3/uL — AB (ref 4.0–10.5)
WBC: 12.5 10*3/uL — ABNORMAL HIGH (ref 4.0–10.5)

## 2017-11-03 LAB — COMPREHENSIVE METABOLIC PANEL
ALBUMIN: 2.9 g/dL — AB (ref 3.5–5.0)
ALK PHOS: 380 U/L — AB (ref 38–126)
ALK PHOS: 597 U/L — AB (ref 38–126)
ALT: 174 U/L — AB (ref 14–54)
ALT: 178 U/L — AB (ref 14–54)
ALT: 207 U/L — ABNORMAL HIGH (ref 14–54)
ANION GAP: 10 (ref 5–15)
AST: 134 U/L — AB (ref 15–41)
AST: 146 U/L — ABNORMAL HIGH (ref 15–41)
AST: 176 U/L — ABNORMAL HIGH (ref 15–41)
Albumin: 2.3 g/dL — ABNORMAL LOW (ref 3.5–5.0)
Albumin: 2 g/dL — ABNORMAL LOW (ref 3.5–5.0)
Alkaline Phosphatase: 517 U/L — ABNORMAL HIGH (ref 38–126)
Anion gap: 11 (ref 5–15)
Anion gap: 10 (ref 5–15)
BILIRUBIN TOTAL: 0.4 mg/dL (ref 0.3–1.2)
BILIRUBIN TOTAL: 0.5 mg/dL (ref 0.3–1.2)
BUN: 12 mg/dL (ref 6–20)
BUN: 12 mg/dL (ref 6–20)
BUN: 13 mg/dL (ref 6–20)
CALCIUM: 8 mg/dL — AB (ref 8.9–10.3)
CO2: 16 mmol/L — ABNORMAL LOW (ref 22–32)
CO2: 18 mmol/L — ABNORMAL LOW (ref 22–32)
CO2: 16 mmol/L — ABNORMAL LOW (ref 22–32)
CREATININE: 1.04 mg/dL — AB (ref 0.44–1.00)
Calcium: 9.4 mg/dL (ref 8.9–10.3)
Calcium: 8.6 mg/dL — ABNORMAL LOW (ref 8.9–10.3)
Chloride: 108 mmol/L (ref 101–111)
Chloride: 105 mmol/L (ref 101–111)
Chloride: 103 mmol/L (ref 101–111)
Creatinine, Ser: 0.82 mg/dL (ref 0.44–1.00)
Creatinine, Ser: 1.08 mg/dL — ABNORMAL HIGH (ref 0.44–1.00)
GFR calc Af Amer: >60 mL/min (ref >60)
GFR calc Af Amer: >60 mL/min (ref >60)
GFR calc non Af Amer: >60 mL/min (ref >60)
GLUCOSE: 112 mg/dL — AB (ref 65–99)
Glucose, Bld: 112 mg/dL — ABNORMAL HIGH (ref 65–99)
Glucose, Bld: 160 mg/dL — ABNORMAL HIGH (ref 65–99)
POTASSIUM: 4.2 mmol/L (ref 3.5–5.1)
POTASSIUM: 4.6 mmol/L (ref 3.5–5.1)
Potassium: 3.7 mmol/L (ref 3.5–5.1)
Sodium: 135 mmol/L (ref 135–145)
Sodium: 133 mmol/L — ABNORMAL LOW (ref 135–145)
Sodium: 129 mmol/L — ABNORMAL LOW (ref 135–145)
TOTAL PROTEIN: 6.3 g/dL — AB (ref 6.5–8.1)
TOTAL PROTEIN: 7 g/dL (ref 6.5–8.1)
Total Bilirubin: 0.4 mg/dL (ref 0.3–1.2)
Total Protein: 5.5 g/dL — ABNORMAL LOW (ref 6.5–8.1)

## 2017-11-03 LAB — ABO/RH: ABO/RH(D): O POS

## 2017-11-03 LAB — MAGNESIUM: MAGNESIUM: 6.7 mg/dL — AB (ref 1.7–2.4)

## 2017-11-03 LAB — TYPE AND SCREEN
ABO/RH(D): O POS
Antibody Screen: NEGATIVE

## 2017-11-03 MED ORDER — DOCUSATE SODIUM 100 MG PO CAPS
100.0000 mg | ORAL_CAPSULE | Freq: Two times a day (BID) | ORAL | Status: DC
  Administered 2017-11-04 – 2017-11-05 (×3): 100 mg via ORAL
  Filled 2017-11-03 (×3): qty 1

## 2017-11-03 MED ORDER — LABETALOL HCL 5 MG/ML IV SOLN: 20.0000 mg | INTRAVENOUS | Status: DC | PRN

## 2017-11-03 MED ORDER — PRENATAL MULTIVITAMIN CH
1.0000 | ORAL_TABLET | Freq: Every day | ORAL | Status: DC
  Administered 2017-11-04 – 2017-11-05 (×2): 1 via ORAL
  Filled 2017-11-03 (×2): qty 1

## 2017-11-03 MED ORDER — OXYCODONE-ACETAMINOPHEN 5-325 MG PO TABS: 2.0000 | ORAL_TABLET | ORAL | Status: DC | PRN

## 2017-11-03 MED ORDER — TERBUTALINE SULFATE 1 MG/ML IJ SOLN: 0.2500 mg | Freq: Once | INTRAMUSCULAR | Status: DC | PRN

## 2017-11-03 MED ORDER — HYDRALAZINE HCL 20 MG/ML IJ SOLN: 10.0000 mg | Freq: Once | INTRAMUSCULAR | Status: DC | PRN

## 2017-11-03 MED ORDER — LACTATED RINGERS IV SOLN: 500.0000 mL | Freq: Once | INTRAVENOUS | Status: DC

## 2017-11-03 MED ORDER — PHENYLEPHRINE 40 MCG/ML (10ML) SYRINGE FOR IV PUSH (FOR BLOOD PRESSURE SUPPORT)
80.0000 ug | PREFILLED_SYRINGE | INTRAVENOUS | Status: DC | PRN
  Filled 2017-11-03: qty 10
  Filled 2017-11-03: qty 5

## 2017-11-03 MED ORDER — OXYTOCIN 40 UNITS IN LACTATED RINGERS INFUSION - SIMPLE MED: 2.5000 [IU]/h | INTRAVENOUS | Status: DC

## 2017-11-03 MED ORDER — MAGNESIUM SULFATE 40 G IN LACTATED RINGERS - SIMPLE
2.0000 g/h | INTRAVENOUS | Status: DC
  Administered 2017-11-03: 2 g/h via INTRAVENOUS
  Filled 2017-11-03: qty 40
  Filled 2017-11-03: qty 500

## 2017-11-03 MED ORDER — DIBUCAINE 1 % RE OINT: 1.0000 "application " | TOPICAL_OINTMENT | RECTAL | Status: DC | PRN

## 2017-11-03 MED ORDER — SENNOSIDES-DOCUSATE SODIUM 8.6-50 MG PO TABS
2.0000 | ORAL_TABLET | ORAL | Status: DC
  Administered 2017-11-04 (×2): 2 via ORAL
  Filled 2017-11-03 (×2): qty 2

## 2017-11-03 MED ORDER — ACETAMINOPHEN 325 MG PO TABS: 650.0000 mg | ORAL_TABLET | ORAL | Status: DC | PRN

## 2017-11-03 MED ORDER — SIMETHICONE 80 MG PO CHEW
80.0000 mg | CHEWABLE_TABLET | ORAL | Status: DC | PRN
Start: 2017-11-03 — End: 2017-11-05

## 2017-11-03 MED ORDER — IBUPROFEN 600 MG PO TABS
600.0000 mg | ORAL_TABLET | Freq: Four times a day (QID) | ORAL | Status: DC
  Administered 2017-11-04 – 2017-11-05 (×7): 600 mg via ORAL
  Filled 2017-11-03 (×7): qty 1

## 2017-11-03 MED ORDER — DIPHENHYDRAMINE HCL 50 MG/ML IJ SOLN: 12.5000 mg | INTRAMUSCULAR | Status: DC | PRN

## 2017-11-03 MED ORDER — LACTATED RINGERS IV SOLN
INTRAVENOUS | Status: DC
  Administered 2017-11-03 (×2): via INTRAVENOUS

## 2017-11-03 MED ORDER — ONDANSETRON HCL 4 MG/2ML IJ SOLN: 4.0000 mg | Freq: Four times a day (QID) | INTRAMUSCULAR | Status: DC | PRN

## 2017-11-03 MED ORDER — LIDOCAINE HCL (PF) 1 % IJ SOLN
30.0000 mL | INTRAMUSCULAR | Status: DC | PRN
  Administered 2017-11-03: 30 mL via SUBCUTANEOUS
  Filled 2017-11-03: qty 30

## 2017-11-03 MED ORDER — EPHEDRINE 5 MG/ML INJ
10.0000 mg | INTRAVENOUS | Status: DC | PRN
  Filled 2017-11-03: qty 2

## 2017-11-03 MED ORDER — ONDANSETRON HCL 4 MG PO TABS: 4.0000 mg | ORAL_TABLET | ORAL | Status: DC | PRN

## 2017-11-03 MED ORDER — PHENYLEPHRINE 40 MCG/ML (10ML) SYRINGE FOR IV PUSH (FOR BLOOD PRESSURE SUPPORT)
80.0000 ug | PREFILLED_SYRINGE | INTRAVENOUS | Status: DC | PRN
  Filled 2017-11-03: qty 5

## 2017-11-03 MED ORDER — DIPHENHYDRAMINE HCL 25 MG PO CAPS: 25.0000 mg | ORAL_CAPSULE | Freq: Four times a day (QID) | ORAL | Status: DC | PRN

## 2017-11-03 MED ORDER — ONDANSETRON HCL 4 MG/2ML IJ SOLN: 4.0000 mg | INTRAMUSCULAR | Status: DC | PRN

## 2017-11-03 MED ORDER — SOD CITRATE-CITRIC ACID 500-334 MG/5ML PO SOLN: 30.0000 mL | ORAL | Status: DC | PRN

## 2017-11-03 MED ORDER — LIDOCAINE HCL (PF) 1 % IJ SOLN
INTRAMUSCULAR | Status: DC | PRN
  Administered 2017-11-03: 5 mL via EPIDURAL
  Administered 2017-11-03: 7 mL via EPIDURAL

## 2017-11-03 MED ORDER — TETANUS-DIPHTH-ACELL PERTUSSIS 5-2.5-18.5 LF-MCG/0.5 IM SUSP: 0.5000 mL | Freq: Once | INTRAMUSCULAR | Status: DC

## 2017-11-03 MED ORDER — OXYCODONE-ACETAMINOPHEN 5-325 MG PO TABS: 1.0000 | ORAL_TABLET | ORAL | Status: DC | PRN

## 2017-11-03 MED ORDER — ZOLPIDEM TARTRATE 5 MG PO TABS: 5.0000 mg | ORAL_TABLET | Freq: Every evening | ORAL | Status: DC | PRN

## 2017-11-03 MED ORDER — MAGNESIUM SULFATE BOLUS VIA INFUSION
4.0000 g | Freq: Once | INTRAVENOUS | Status: AC
  Administered 2017-11-03: 4 g via INTRAVENOUS
  Filled 2017-11-03: qty 500

## 2017-11-03 MED ORDER — OXYTOCIN 40 UNITS IN LACTATED RINGERS INFUSION - SIMPLE MED
1.0000 m[IU]/min | INTRAVENOUS | Status: DC
  Administered 2017-11-03: 1 m[IU]/min via INTRAVENOUS
  Filled 2017-11-03: qty 1000

## 2017-11-03 MED ORDER — LABETALOL HCL 5 MG/ML IV SOLN
20.0000 mg | INTRAVENOUS | Status: AC | PRN
  Administered 2017-11-03: 20 mg via INTRAVENOUS
  Administered 2017-11-03: 40 mg via INTRAVENOUS
  Administered 2017-11-03: 80 mg via INTRAVENOUS
  Filled 2017-11-03 (×3): qty 4
  Filled 2017-11-03: qty 16
  Filled 2017-11-03 (×2): qty 8

## 2017-11-03 MED ORDER — FERROUS SULFATE 325 (65 FE) MG PO TABS
325.0000 mg | ORAL_TABLET | Freq: Two times a day (BID) | ORAL | Status: DC
  Administered 2017-11-04 – 2017-11-05 (×3): 325 mg via ORAL
  Filled 2017-11-03 (×4): qty 1

## 2017-11-03 MED ORDER — LACTATED RINGERS IV SOLN
500.0000 mL | INTRAVENOUS | Status: DC | PRN
  Administered 2017-11-03: 250 mL via INTRAVENOUS

## 2017-11-03 MED ORDER — FENTANYL 2.5 MCG/ML BUPIVACAINE 1/10 % EPIDURAL INFUSION (WH - ANES)
14.0000 mL/h | INTRAMUSCULAR | Status: DC | PRN
  Administered 2017-11-03 (×2): 14 mL/h via EPIDURAL
  Filled 2017-11-03 (×2): qty 100

## 2017-11-03 MED ORDER — LACTATED RINGERS IV SOLN
INTRAVENOUS | Status: DC
  Administered 2017-11-03 – 2017-11-04 (×2): via INTRAVENOUS

## 2017-11-03 MED ORDER — COCONUT OIL OIL: 1.0000 "application " | TOPICAL_OIL | Status: DC | PRN

## 2017-11-03 MED ORDER — WITCH HAZEL-GLYCERIN EX PADS: 1.0000 "application " | MEDICATED_PAD | CUTANEOUS | Status: DC | PRN

## 2017-11-03 MED ORDER — FLEET ENEMA 7-19 GM/118ML RE ENEM: 1.0000 | ENEMA | Freq: Every day | RECTAL | Status: DC | PRN

## 2017-11-03 MED ORDER — BENZOCAINE-MENTHOL 20-0.5 % EX AERO
1.0000 "application " | INHALATION_SPRAY | CUTANEOUS | Status: DC | PRN
  Administered 2017-11-04: 1 via TOPICAL
  Filled 2017-11-03: qty 56

## 2017-11-03 MED ORDER — MAGNESIUM SULFATE 40 G IN LACTATED RINGERS - SIMPLE
2.0000 g/h | INTRAVENOUS | Status: AC
  Administered 2017-11-03 (×2): 2 g/h via INTRAVENOUS
  Filled 2017-11-03: qty 40

## 2017-11-03 MED ORDER — OXYTOCIN BOLUS FROM INFUSION
500.0000 mL | Freq: Once | INTRAVENOUS | Status: AC
  Administered 2017-11-03: 500 mL via INTRAVENOUS

## 2017-11-03 NOTE — Anesthesia Pain Management Evaluation Note (Signed)
  CRNA Pain Management Visit Note  Patient: Sharon Harvey, 42 y.o., female  "Hello I am a member of the anesthesia team at [facility] We have an anesthesia team available at all times to provide care throughout the hospital, including epidural management and anesthesia for C-section. I don't know your plan for the delivery whether it a natural birth, water birth, IV sedation, nitrous supplementation, doula or epidural, but we want to meet your pain goals."   1.Was your pain managed to your expectations on prior hospitalizations?   Yes   2.What is your expectation for pain management during this hospitalization?     Epidural  3.How can we help you reach that goal? Currently has epidural  Record the patient's initial score and the patient's pain goal.   Pain: 1  Pain Goal: 4 [facility] wants you to be able to say your pain was always managed very well.  [facility] 11/03/2017

## 2017-11-03 NOTE — H&P (Addendum)
LABOR AND DELIVERY ADMISSION HISTORY AND PHYSICAL NOTE  Sharon Harvey is a 42 y.o. female G2P1001 with IUP at 10w4dby LMP presenting for SOL. Starting having contractions and some discharge.   She reports positive fetal movement. She denies  vaginal bleeding. No HA, vision changes, RUQ pain, LE edema  Prenatal History/Complications: AMA, cHTN, h/o C section per chart review was performed due to hospital workers were going on strike before her due date.   Past Medical History: Past Medical History:  Diagnosis Date  . Hypertension   . Medical history non-contributory     Past Surgical History: Past Surgical History:  Procedure Laterality Date  . CESAREAN SECTION      Obstetrical History: OB History    Gravida  2   Para  1   Term  1   Preterm      AB      Living  1     SAB      TAB      Ectopic      Multiple      Live Births  1           Social History: Social History   Socioeconomic History  . Marital status: Married    Spouse name: Not on file  . Number of children: Not on file  . Years of education: Not on file  . Highest education level: Not on file  Occupational History  . Not on file  Social Needs  . Financial resource strain: Not on file  . Food insecurity:    Worry: Not on file    Inability: Not on file  . Transportation needs:    Medical: Not on file    Non-medical: Not on file  Tobacco Use  . Smoking status: Never Smoker  . Smokeless tobacco: Never Used  Substance and Sexual Activity  . Alcohol use: No    Frequency: Never  . Drug use: No  . Sexual activity: Yes    Birth control/protection: None  Lifestyle  . Physical activity:    Days per week: Not on file    Minutes per session: Not on file  . Stress: Not on file  Relationships  . Social connections:    Talks on phone: Not on file    Gets together: Not on file    Attends religious service: Not on file    Active member of club or organization: Not on file    Attends  meetings of clubs or organizations: Not on file    Relationship status: Not on file  Other Topics Concern  . Not on file  Social History Narrative  . Not on file    Family History: Family History  Problem Relation Age of Onset  . ADD / ADHD Neg Hx   . Anxiety disorder Neg Hx   . Alcohol abuse Neg Hx   . Arthritis Neg Hx   . Asthma Neg Hx   . Birth defects Neg Hx   . Cancer Neg Hx   . COPD Neg Hx   . Depression Neg Hx   . Diabetes Neg Hx   . Drug abuse Neg Hx   . Early death Neg Hx   . Hearing loss Neg Hx   . Heart disease Neg Hx   . Hyperlipidemia Neg Hx   . Hypertension Neg Hx   . Intellectual disability Neg Hx   . Kidney disease Neg Hx   . Learning disabilities Neg Hx   . Miscarriages /  Stillbirths Neg Hx   . Obesity Neg Hx   . Stroke Neg Hx   . Vision loss Neg Hx   . Varicose Veins Neg Hx     Allergies: No Known Allergies  Medications Prior to Admission  Medication Sig Dispense Refill Last Dose  . Prenatal Multivit-Min-Fe-FA (PRENATAL VITAMINS PO) Take by mouth.   11/02/2017 at Unknown time     Review of Systems   All systems reviewed and negative except as stated in HPI  Blood pressure (!) 155/82, pulse 84, temperature 98.5 F (36.9 C), resp. rate 20, height '5\' 5"'$  (1.651 m), weight 98.3 kg (216 lb 12 oz), last menstrual period 02/06/2017, SpO2 100 %. General appearance: alert, cooperative, appears stated age and no distress Lungs: no respiratory distress Heart: regular rate Abdomen: soft, non-tender Extremities: No calf swelling or tenderness. No LE edema. Presentation: cephalic by RN cervical exam Fetal monitoring: 130bpm, moderate variability, +accelerations, no decelerations Uterine activity: q2-50mn Dilation: 3 Effacement (%): 80 Station: -3 Exam by:: BGeorgette Dover RN   Prenatal labs: ABO, Rh: O/Positive/-- (11/27 1548) Antibody: Negative (11/27 1548) Rubella: nonimmune <0.90 RPR: Non Reactive (03/18 0913)  HBsAg: Negative (11/27 1548)   HIV: Non Reactive (03/18 0913)  GBS: Negative (05/06 1336)  Genetic screening:  Not performed Anatomy [place] normal  Prenatal Transfer Tool  Maternal Diabetes: No Genetic Screening: Declined Maternal Ultrasounds/Referrals: Normal Fetal Ultrasounds or other Referrals:  None Maternal Substance Abuse:  No Significant Maternal Medications:  None Significant Maternal Lab Results: Lab values include: Group B Strep negative  Results for orders placed or performed during the hospital encounter of 11/02/17 (from the past 24 hour(s))  Urinalysis, Routine w reflex microscopic   Collection Time: 11/02/17  3:14 PM  Result Value Ref Range   Color, Urine YELLOW YELLOW   APPearance CLEAR CLEAR   Specific Gravity, Urine 1.004 (L) 1.005 - 1.030   pH 7.0 5.0 - 8.0   Glucose, UA NEGATIVE NEGATIVE mg/dL   Hgb urine dipstick NEGATIVE NEGATIVE   Bilirubin Urine NEGATIVE NEGATIVE   Ketones, ur NEGATIVE NEGATIVE mg/dL   Protein, ur NEGATIVE NEGATIVE mg/dL   Nitrite NEGATIVE NEGATIVE   Leukocytes, UA NEGATIVE NEGATIVE    Patient Active Problem List   Diagnosis Date Noted  . [redacted] weeks gestation of pregnancy   . Unstable lie of fetus, antepartum 10/20/2017  . Supervision of high risk pregnancy, antepartum 05/06/2017  . History of cesarean section 05/06/2017  . AMA (advanced maternal age) multigravida 382+ first trimester 05/06/2017  . Chronic hypertension affecting pregnancy 05/06/2017    Assessment: EMercede Rollois a 42y.o. G2P1001 at 35w4dere for SOL, TOLAC. cHTN not on medications now with superimposed preeclampsia with severe range BPs but no symptoms.  #Labor:expectant management. Now with 2 severe range BPs, is asymptomatic. Start labetalol protocol and mag. [place] #Pain: Planning on epidural #FWB:  Cat I #ID:  GBS neg #MOF: breast #MOC: undecided  #Circ:  Outpatient #Rubella nonimmune, needs MMR postpartum  ElBufford LopeDO PGY-2 5/27/201912:33 AM   I confirm that I have  verified the information documented in the resident's note and that I have also personally reperformed the physical exam and all medical decision making activities. The patient was seen and examined by me also Agree with note NST reactive and reassuring UCs as listed Cervical exams as listed in note  WiSeabron SpatesCNM

## 2017-11-03 NOTE — Anesthesia Preprocedure Evaluation (Signed)
Anesthesia Evaluation  Patient identified by MRN, date of birth, ID band Patient awake    Reviewed: Allergy & Precautions, H&P , Patient's Chart, lab work & pertinent test results  Airway Mallampati: II  TM Distance: >3 FB Neck ROM: full    Dental no notable dental hx. (+) Teeth Intact   Pulmonary neg pulmonary ROS,    Pulmonary exam normal breath sounds clear to auscultation       Cardiovascular hypertension, Normal cardiovascular exam Rhythm:regular Rate:Normal     Neuro/Psych negative neurological ROS  negative psych ROS   GI/Hepatic negative GI ROS, Neg liver ROS,   Endo/Other  negative endocrine ROS  Renal/GU negative Renal ROS  negative genitourinary   Musculoskeletal negative musculoskeletal ROS (+)   Abdominal (+) + obese,   Peds  Hematology negative hematology ROS (+)   Anesthesia Other Findings   Reproductive/Obstetrics (+) Pregnancy                             Anesthesia Physical Anesthesia Plan  ASA: II  Anesthesia Plan: Epidural   Post-op Pain Management:    Induction:   PONV Risk Score and Plan:   Airway Management Planned:   Additional Equipment:   Intra-op Plan:   Post-operative Plan:   Informed Consent: I have reviewed the patients History and Physical, chart, labs and discussed the procedure including the risks, benefits and alternatives for the proposed anesthesia with the patient or authorized representative who has indicated his/her understanding and acceptance.     Plan Discussed with:   Anesthesia Plan Comments:         Anesthesia Quick Evaluation

## 2017-11-03 NOTE — Anesthesia Procedure Notes (Signed)
Epidural Patient location during procedure: OB Start time: 11/03/2017 2:08 AM End time: 11/03/2017 2:11 AM  Staffing Anesthesiologist: Lyn Hollingshead, MD Performed: anesthesiologist   Preanesthetic Checklist Completed: patient identified, site marked, surgical consent, pre-op evaluation, timeout performed, IV checked, risks and benefits discussed and monitors and equipment checked  Epidural Patient position: sitting Prep: site prepped and draped and DuraPrep Patient monitoring: continuous pulse ox and blood pressure Approach: midline Location: L3-L4 Injection technique: LOR air  Needle:  Needle type: Tuohy  Needle gauge: 17 G Needle length: 9 cm and 9 Needle insertion depth: 5 cm cm Catheter type: closed end flexible Catheter size: 19 Gauge Catheter at skin depth: 10 cm Test dose: negative and Other  Assessment Sensory level: T9 Events: blood not aspirated, injection not painful, no injection resistance, negative IV test and no paresthesia  Additional Notes Reason for block:procedure for pain

## 2017-11-03 NOTE — Progress Notes (Signed)
Sharon Harvey is a 42 y.o. G2P1001 at [redacted]w[redacted]d by LMP admitted for active labor  Subjective: Patient is resting in bed, comfortable on epidural  Objective: BP 139/62   Pulse 94   Temp 98.7 F (37.1 C) (Oral)   Resp 16   Ht 5\' 5"  (1.651 m)   Wt 98.3 kg (216 lb 12 oz)   LMP 02/06/2017 (Exact Date)   SpO2 99%   BMI 36.07 kg/m  I/O last 3 completed shifts: In: 2008.6 [P.O.:600; I.V.:1342.1; Other:66.5] Out: 1200 [Urine:1200] Total I/O In: 8333 [P.O.:920; I.V.:250; Other:28] Out: 832 [Urine:675]  FHT:  FHR: 130 bpm, variability: moderate,  accelerations:  Present,  decelerations:  Absent UC:   irregular, every 4-6 minutes SVE:   Dilation: 6 Effacement (%): 100 Station: -1 Exam by:: Jack Quarto, RN  Labs: Lab Results  Component Value Date   WBC 11.4 (H) 11/03/2017   HGB 12.1 11/03/2017   HCT 37.1 11/03/2017   MCV 71.6 (L) 11/03/2017   PLT 106 (L) 11/03/2017    Assessment / Plan: Spontaneous labor, progressing normally  Labor:TOLAC  Progressing normally Preeclampsia: severe Bps, controlled on labetalol protocol                         HELLP                          on magnesium sulfate, no signs or symptoms of toxicity and intake and ouput balanced Fetal Wellbeing:  Category I Pain Control:  Epidural I/D:  GBS negative Anticipated MOD:  NSVD  [name], MD, PGY-1 Family Medicine - [place] 11/03/2017, 9:26 AM

## 2017-11-03 NOTE — Progress Notes (Signed)
LABOR PROGRESS NOTE  Oluwatoyin Banales is a 42 y.o. G2P1001 at [redacted]w[redacted]d  admitted for SOL, TOLAC in the setting of cHTN with SIPE HELLP syndrome  Subjective: States doing well, feeling hungry. No other complaints.  Objective: BP (!) 117/54   Pulse 94   Temp 98 F (36.7 C) (Oral)   Resp 16   Ht 5\' 5"  (1.651 m)   Wt 98.3 kg (216 lb 12 oz)   LMP 02/06/2017 (Exact Date)   SpO2 99%   BMI 36.07 kg/m  or  Vitals:   11/03/17 0401 11/03/17 0431 11/03/17 0501 11/03/17 0531  BP: 139/86 125/67 107/63 (!) 117/54  Pulse: 87 81 98 94  Resp: 18 18 16 16   Temp:    98 F (36.7 C)  TempSrc:    Oral  SpO2:      Weight:      Height:         Dilation: 5.5 Effacement (%): 100 Station: -1 Presentation: Vertex Exam by:: Jack Quarto, RN FHT: 135bpm, moderate variability, occ variable decels Uterine activity: q2-55min  Labs: Lab Results  Component Value Date   WBC 11.4 (H) 11/03/2017   HGB 12.1 11/03/2017   HCT 37.1 11/03/2017   MCV 71.6 (L) 11/03/2017   PLT 106 (L) 11/03/2017    Patient Active Problem List   Diagnosis Date Noted  . Uterine contractions during pregnancy 11/03/2017  . [redacted] weeks gestation of pregnancy   . Unstable lie of fetus, antepartum 10/20/2017  . Supervision of high risk pregnancy, antepartum 05/06/2017  . History of cesarean section 05/06/2017  . AMA (advanced maternal age) multigravida 67+, first trimester 05/06/2017  . Chronic hypertension affecting pregnancy 05/06/2017    Assessment / Plan: 42 y.o. G2P1001 at [redacted]w[redacted]d here for SOL, TOLAC in the setting of cHTN with SIPE HELLP syndrome  Labor: BPs well controlled on labetalol and mag.  Fetal Wellbeing:  Cat II Pain Control:  epidural Anticipated MOD:  SVD  [name], DO PGY-2 5/27/20196:14 AM

## 2017-11-03 NOTE — Progress Notes (Signed)
Patient ID: Sharon Harvey, female   DOB: 06-Feb-1976, 42 y.o.   MRN: [id number] Sitting up for epidural  Vitals:   11/03/17 0115 11/03/17 0122 11/03/17 0139 11/03/17 0141  BP: (!) 171/101 (!) 174/96  (!) 145/76  Pulse: 80 79  81  Resp: 18 18  18   Temp:      TempSrc:      SpO2:   98%   Weight:      Height:       FHR stable  Dilation: 3 Effacement (%): 80 Station: -3 Presentation: Vertex Exam by:: Georgette Dover, RN  DTRs 2+3+/no clonus 1+ pedal edema  Results for orders placed or performed during the hospital encounter of 11/02/17 (from the past 24 hour(s))  CBC     Status: Abnormal   Collection Time: 11/03/17 12:38 AM  Result Value Ref Range   WBC 11.4 (H) 4.0 - 10.5 K/uL   RBC 5.18 (H) 3.87 - 5.11 MIL/uL   Hemoglobin 12.1 12.0 - 15.0 g/dL   HCT 37.1 36.0 - 46.0 %   MCV 71.6 (L) 78.0 - 100.0 fL   MCH 23.4 (L) 26.0 - 34.0 pg   MCHC 32.6 30.0 - 36.0 g/dL   RDW 18.0 (H) 11.5 - 15.5 %   Platelets 106 (L) 150 - 400 K/uL  Comprehensive metabolic panel     Status: Abnormal   Collection Time: 11/03/17 12:38 AM  Result Value Ref Range   Sodium 135 135 - 145 mmol/L   Potassium 4.2 3.5 - 5.1 mmol/L   Chloride 108 101 - 111 mmol/L   CO2 16 (L) 22 - 32 mmol/L   Glucose, Bld 112 (H) 65 - 99 mg/dL   BUN 13 6 - 20 mg/dL   Creatinine, Ser 0.82 0.44 - 1.00 mg/dL   Calcium 9.4 8.9 - 10.3 mg/dL   Total Protein 7.0 6.5 - 8.1 g/dL   Albumin 2.9 (L) 3.5 - 5.0 g/dL   AST 134 (H) 15 - 41 U/L   ALT 178 (H) 14 - 54 U/L   Alkaline Phosphatase 597 (H) 38 - 126 U/L   Total Bilirubin 0.5 0.3 - 1.2 mg/dL   GFR calc non Af Amer >60 >60 mL/min   GFR calc Af Amer >60 >60 mL/min   Anion gap 11 5 - 15  Type and screen [place]     Status: None   Collection Time: 11/03/17 12:38 AM  Result Value Ref Range   ABO/RH(D) O POS    Antibody Screen NEG    Sample Expiration      11/06/2017 Performed at [facility], [address] [place], [place] [address]    A:  Single  IUP at [redacted]w[redacted]d       Preeclampsia with HELLP       Latent labor P:   Already on MgSO4       Labetalol for Bp       Epidural now        Continue to observe Seabron Spates, CNM

## 2017-11-03 NOTE — Progress Notes (Signed)
Sharon Harvey is a 42 y.o. G2P1001 at [redacted]w[redacted]d by LMP admitted for active labor  Subjective: Patient is resting in bed, tolerating contractions well on epidural  Objective: BP (!) 143/65   Pulse 92   Temp 97.9 F (36.6 C) (Oral)   Resp 16   Ht 5\' 5"  (1.651 m)   Wt 98.3 kg (216 lb 12 oz)   LMP 02/06/2017 (Exact Date)   SpO2 99%   BMI 36.07 kg/m  I/O last 3 completed shifts: In: 2008.6 [P.O.:600; I.V.:1342.1; Other:66.5] Out: 1200 [Urine:1200] Total I/O In: 6789 [P.O.:2620; I.V.:1000; Other:112] Out: 1725 [Urine:1725]  FHT:  FHR: 130 bpm, variability: moderate,  accelerations:  Present,  decelerations:  Absent UC:   irregular, every 4-6 minutes SVE:   Dilation: 7 Effacement (%): 80 Station: -2 Exam by:: Dr. Lambert Keto  Labs: Lab Results  Component Value Date   WBC 11.4 (H) 11/03/2017   HGB 12.1 11/03/2017   HCT 37.1 11/03/2017   MCV 71.6 (L) 11/03/2017   PLT 106 (L) 11/03/2017    Assessment / Plan: Spontaneous labor, progressing normally  Labor: TOLAC, in active labor, unchanged since last check and contractions spacing out, will start Pit for augmentation  Preeclampsia:  severe Bps, controlled on labetalol protocol                         HELLP                          on magnesium sulfate, no signs or symptoms  Fetal Wellbeing:  Category I Pain Control:  Epidural I/D:  n/a Anticipated MOD:  NSVD  Sharon Browns, MD, PGY-1 Family Medicine - [place] 11/03/2017, 12:11 PM

## 2017-11-03 NOTE — Anesthesia Postprocedure Evaluation (Signed)
Anesthesia Post Note  Patient: Sharon Harvey  Procedure(s) Performed: AN AD [facility]     Patient location during evaluation: Mother Baby Anesthesia Type: Epidural Level of consciousness: awake and alert Pain management: pain level controlled Vital Signs Assessment: post-procedure vital signs reviewed and stable Respiratory status: spontaneous breathing, nonlabored ventilation and respiratory function stable Cardiovascular status: stable Postop Assessment: no headache, no backache, epidural receding, no apparent nausea or vomiting, patient able to bend at knees, able to ambulate and adequate PO intake Anesthetic complications: no    Last Vitals:  Vitals:   11/03/17 1954 11/03/17 2028  BP: 110/70 110/68  Pulse: 77 81  Resp: 18 16  Temp:  36.7 C  SpO2:  100%    Last Pain:  Vitals:   11/03/17 2028  TempSrc:   PainSc: 0-No pain   Pain Goal:                 [employer]

## 2017-11-03 NOTE — Progress Notes (Signed)
Critical lab result of Mag called to RN. Result made aware to Dr. Shawna Orleans & Degle. Result ok. Will continue to monitor.

## 2017-11-03 NOTE — Progress Notes (Signed)
Josue Kass is a 42 y.o. G2P1001 at [redacted]w[redacted]d by LMP admitted for rupture of membranes  Subjective:   Objective: BP 123/73   Pulse 77   Temp 98.4 F (36.9 C) (Oral)   Resp 20   Ht 5\' 5"  (1.651 m)   Wt 98.3 kg (216 lb 12 oz)   LMP 02/06/2017 (Exact Date)   SpO2 99%   BMI 36.07 kg/m  I/O last 3 completed shifts: In: 2008.6 [P.O.:600; I.V.:1342.1; Other:66.5] Out: 1200 [Urine:1200] Total I/O In: 1610 [P.O.:2870; I.V.:1125; Other:126] Out: 1975 [Urine:1975]  FHT:  FHR: 125 bpm, variability: minimal ,  accelerations:  Present,  decelerations:  Present early UC:   irregular, every 3-4 minutes SVE:   Dilation: 8 Effacement (%): 90 Station: -1 Exam by:: Dr Armanda Heritage  Labs: Lab Results  Component Value Date   WBC 11.4 (H) 11/03/2017   HGB 12.1 11/03/2017   HCT 37.1 11/03/2017   MCV 71.6 (L) 11/03/2017   PLT 106 (L) 11/03/2017    Assessment / Plan: Augmentation of labor, progressing well  Labor: TOLAC progressing well on Pitocin Preeclampsia: severe Bps, controlled on labetalol protocol                         HELLP                          on magnesium sulfate, no signs or symptoms  Fetal Wellbeing:  Category II Pain Control:  Epidural I/D:  n/a Anticipated MOD:  NSVD  [name], MD, PGY-1 Family Medicine - [place] 11/03/2017, 4:11 PM

## 2017-11-04 ENCOUNTER — Encounter: Payer: Medicaid Other | Admitting: Family Medicine

## 2017-11-04 ENCOUNTER — Other Ambulatory Visit: Payer: Medicaid Other

## 2017-11-04 LAB — CBC
HCT: 24.7 % — ABNORMAL LOW (ref 36.0–46.0)
HEMOGLOBIN: 8.2 g/dL — AB (ref 12.0–15.0)
MCH: 23.6 pg — AB (ref 26.0–34.0)
MCHC: 33.2 g/dL (ref 30.0–36.0)
MCV: 71 fL — ABNORMAL LOW (ref 78.0–100.0)
Platelets: 109 10*3/uL — ABNORMAL LOW (ref 150–400)
RBC: 3.48 MIL/uL — ABNORMAL LOW (ref 3.87–5.11)
RDW: 17.8 % — AB (ref 11.5–15.5)
WBC: 13.8 10*3/uL — ABNORMAL HIGH (ref 4.0–10.5)

## 2017-11-04 LAB — COMPREHENSIVE METABOLIC PANEL
ALBUMIN: 2.1 g/dL — AB (ref 3.5–5.0)
ALK PHOS: 325 U/L — AB (ref 38–126)
ALT: 147 U/L — ABNORMAL HIGH (ref 14–54)
ANION GAP: 9 (ref 5–15)
AST: 98 U/L — ABNORMAL HIGH (ref 15–41)
BUN: 12 mg/dL (ref 6–20)
CALCIUM: 8.1 mg/dL — AB (ref 8.9–10.3)
CO2: 18 mmol/L — AB (ref 22–32)
Chloride: 103 mmol/L (ref 101–111)
Creatinine, Ser: 0.99 mg/dL (ref 0.44–1.00)
GFR calc Af Amer: >60 mL/min (ref >60)
GFR calc non Af Amer: >60 mL/min (ref >60)
GLUCOSE: 90 mg/dL (ref 65–99)
POTASSIUM: 4.1 mmol/L (ref 3.5–5.1)
SODIUM: 130 mmol/L — AB (ref 135–145)
TOTAL PROTEIN: 5.6 g/dL — AB (ref 6.5–8.1)

## 2017-11-04 LAB — RPR: RPR Ser Ql: NONREACTIVE

## 2017-11-04 MED ORDER — MEASLES, MUMPS & RUBELLA VAC ~~LOC~~ INJ
0.5000 mL | INJECTION | Freq: Once | SUBCUTANEOUS | Status: DC
Start: 2017-11-05 — End: 2017-11-05
  Filled 2017-11-04: qty 0.5

## 2017-11-04 NOTE — Progress Notes (Addendum)
Daily Postpartum Note  Admission Date: 11/02/2017 Current Date: 11/04/2017 3:12 PM  Sharon Harvey is a 42 y.o. [id number] PPD#1 s/p VBAC/2nd degree, periuretheral and sulcal @ [redacted]w[redacted]d. Patient admitted for IOL due to severe pre-x superimposed on cHTN that developed into HELLP  Pregnancy complicated by: Patient Active Problem List   Diagnosis Date Noted  . Uterine contractions during pregnancy 11/03/2017  . 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 SVD (spontaneous vaginal delivery) 11/03/2017  . [redacted] weeks gestation of pregnancy   . Unstable lie of fetus, antepartum 10/20/2017  . Supervision of high risk pregnancy, antepartum 05/06/2017  . History of cesarean section 05/06/2017  . AMA (advanced maternal age) multigravida 77+, first trimester 05/06/2017  . Chronic hypertension affecting pregnancy 05/06/2017    Overnight/24hr events:  none  Subjective:  No s/s of pre-eclampsia  Objective:     Current Vital Signs 24h Vital Sign Ranges  T 98.8 F (37.1 C) Temp  Avg: 98.4 F (36.9 C)  Min: 98 F (36.7 C)  Max: 98.9 F (37.2 C)  BP 110/63 BP  Min: 47/21  Max: 162/80  HR 83 Pulse  Avg: 78.6  Min: 69  Max: 115  RR 18 Resp  Avg: 17.6  Min: 15  Max: 20  SaO2 100 % Room Air SpO2  Avg: 99.8 %  Min: 99 %  Max: 100 %       24 Hour I/O Current Shift I/O  Time Ins Outs 05/27 0701 - 05/28 0700 In: 10312.7 [P.O.:5970; I.V.:3966.7] Out: 5732 [Urine:5200] 05/28 0701 - 05/28 1900 In: 2975 [P.O.:2100; I.V.:875] Out: 2025 [Urine:3375]   UOP: >172mL/hr   Patient Vitals for the past 24 hrs:  BP Temp Temp src Pulse Resp SpO2  11/04/17 1400 - - - - 18 -  11/04/17 1300 - - - - 18 -  11/04/17 1223 110/63 98.8 F (37.1 C) Oral 83 18 100 %  11/04/17 1100 - - - - 18 -  11/04/17 1000 - - - - 18 -  11/04/17 0900 - - - - 18 -  11/04/17 0747 104/63 98.9 F (37.2 C) Oral 83 18 100 %  11/04/17 0409 100/61 - - 77 - -  11/04/17 0403 98/61 98.1 F (36.7 C) - 74 17 100 %  11/04/17 0300 - - - - 16 -  11/04/17  0200 - - - - 18 -  11/04/17 0100 - - - - 18 -  11/04/17 0001 107/64 98.3 F (36.8 C) - 91 16 99 %  11/04/17 0000 - - - - 16 -  11/03/17 2300 - - - - 16 -  11/03/17 2200 - - - - 16 -  11/03/17 2139 (!) 93/53 98.4 F (36.9 C) Oral 76 16 100 %  11/03/17 2028 110/68 98 F (36.7 C) - 81 16 100 %  11/03/17 1954 110/70 - - 77 18 -  11/03/17 1931 (!) 95/44 - - 72 18 -  11/03/17 1910 (!) 94/54 - - 69 - -  11/03/17 1904 (!) 89/48 - - 71 17 -  11/03/17 1900 (!) 47/21 - - 74 - -  11/03/17 1846 102/60 - - 74 20 -  11/03/17 1831 97/73 - - 76 17 -  11/03/17 1816 117/73 - - 77 20 -  11/03/17 1801 116/68 98.5 F (36.9 C) Oral 74 16 -  11/03/17 1751 113/69 - - 75 20 -  11/03/17 1741 121/68 - - 74 16 -  11/03/17 1731 122/69 - - 76 20 -  11/03/17 1721  119/70 - - (!) 115 16 -  11/03/17 1711 127/67 - - 75 20 -  11/03/17 1701 126/73 - - 82 16 -  11/03/17 1655 - - - - 20 -  11/03/17 1651 (!) 120/96 - - 81 18 -  11/03/17 1631 133/85 - - 85 20 -  11/03/17 1621 121/75 - - 71 16 -  11/03/17 1611 123/69 - - 72 15 -  11/03/17 1600 123/73 98.4 F (36.9 C) Oral 77 - -  11/03/17 1551 118/71 - - 75 20 -  11/03/17 1541 117/70 - - 82 16 -  11/03/17 1531 (!) 162/80 - - 89 18 -    Physical exam: General: Well nourished, well developed female in no acute distress. GU: deferred, normal looking uop Abdomen: nttp, firm fundus below the umbilicus Cardiovascular: S1, S2 normal, no murmur, rub or gallop, regular rate and rhythm Respiratory: CTAB Extremities: no clubbing, cyanosis or edema Skin: Warm and dry.   Medications: Current Facility-Administered Medications  Medication Dose Route Frequency Provider Last Rate Last Dose  . acetaminophen (TYLENOL) tablet 650 mg  650 mg Oral Q4H PRN Vickki Muff T, MD      . benzocaine-Menthol (DERMOPLAST) 20-0.5 % topical spray 1 application  1 application Topical PRN [name], MD   1 application at 93/23/55 0409  . coconut oil  1 application Topical PRN [name], MD      . witch hazel-glycerin (TUCKS) pad 1 application  1 application Topical PRN Vickki Muff T, MD       And  . dibucaine (NUPERCAINAL) 1 % rectal ointment 1 application  1 application Rectal PRN Vickki Muff T, MD      . diphenhydrAMINE (BENADRYL) capsule 25 mg  25 mg Oral Q6H PRN Vickki Muff T, MD      . docusate sodium (COLACE) capsule 100 mg  100 mg Oral BID Vickki Muff T, MD   100 mg at 11/04/17 0909  . ferrous sulfate tablet 325 mg  325 mg Oral BID WC Degele, Jenne Pane, MD   325 mg at 11/04/17 0859  . hydrALAZINE (APRESOLINE) injection 10 mg  10 mg Intravenous Once PRN Vickki Muff T, MD      . ibuprofen (ADVIL,MOTRIN) tablet 600 mg  600 mg Oral Q6H Vidal, Camila T, MD   600 mg at 11/04/17 1145  . labetalol (NORMODYNE,TRANDATE) injection 20-80 mg  20-80 mg Intravenous Q10 min PRN Vickki Muff T, MD      . lactated ringers infusion   Intravenous Continuous Degele, Jenne Pane, MD 100 mL/hr at 11/04/17 0912    . lidocaine (PF) (XYLOCAINE) 1 % injection 30 mL  30 mL Subcutaneous PRN Orson Eva J, DO   30 mL at 11/03/17 1654  . magnesium sulfate 40 grams in LR 500 mL OB infusion  2 g/hr Intravenous Continuous Aletha Halim, MD 25 mL/hr at 11/03/17 2154 2 g/hr at 11/03/17 2154  . ondansetron (ZOFRAN) tablet 4 mg  4 mg Oral Q4H PRN Vickki Muff T, MD       Or  . ondansetron (ZOFRAN) injection 4 mg  4 mg Intravenous Q4H PRN Vickki Muff T, MD      . prenatal multivitamin tablet 1 tablet  1 tablet Oral Q1200 [name], MD   1 tablet at 11/04/17 1145  . senna-docusate (Senokot-S) tablet 2 tablet  2 tablet Oral Q24H [name], MD   2 tablet at 11/04/17 0014  . simethicone (MYLICON) chewable tablet 80  mg  80 mg Oral PRN Vickki Muff T, MD      . Tdap (BOOSTRIX) injection 0.5 mL  0.5 mL Intramuscular Once Vickki Muff T, MD      . zolpidem (AMBIEN) tablet 5 mg  5 mg Oral QHS PRN [name], MD        Labs:  Recent Labs  Lab 11/03/17 1634 11/03/17 2204  11/04/17 0805  WBC 12.4* 12.5* 13.8*  HGB 11.2* 9.1* 8.2*  HCT 34.2* 28.1* 24.7*  PLT 107* 102* 109*    Recent Labs  Lab 11/03/17 1634 11/03/17 2204 11/04/17 0805  NA 133* 129* 130*  K 4.6 3.7 4.1  CL 105 103 103  CO2 18* 16* 18*  BUN 12 12 12   CREATININE 1.08* 1.04* 0.99  CALCIUM 8.6* 8.0* 8.1*  PROT 6.3* 5.5* 5.6*  BILITOT 0.4 0.4 <0.1*  ALKPHOS 517* 380* 325*  ALT 207* 174* 147*  AST 176* 146* 98*  GLUCOSE 112* 160* 90     Radiology: none  Assessment & Plan:  Pt improving *PP: routine care. Remove foley once Mg off. O pos, unsure on birth control, breast *cHTN with superimposed HELLP: continue Mg until 24hrs PP (until 1700 today). Repeat labs tomorrow. Doing well on no meds except Mg *PPx: SCDs, OOB ad lib *FEN/GI: regular diet, MIVF *Dispo: likely tomorrow. Request sent for 1wk bp check to [place]. MD Attending Center for [employer] [facility])

## 2017-11-04 NOTE — Lactation Note (Signed)
This note was copied from a baby's chart. Lactation Consultation Note  Patient Name: Sharon Harvey [id number] Date: 11/04/2017 Reason for consult: Initial assessment Mom has GHTN, on MgSO4  Visited with P2 Mom of 64 hr old term baby. Baby has latched on and fed at breast 5-6 times.  Mom denies any difficulty of discomfort with latch.   Baby swaddled in crib.  Encouraged STS and cue based feedings, goal of >8 feedings per 24 hrs. Reviewed breast massage and hand expression. Mom given Lactation brochure.  Informed of IP and OP lactation services available to her. Mom encouraged to ask for help prn.    Consult Status Consult Status: Follow-up Date: 11/05/17 Follow-up type: In-patient    Broadus John 11/04/2017, 12:08 PM

## 2017-11-04 NOTE — Progress Notes (Signed)
Pt Sleeping

## 2017-11-05 ENCOUNTER — Institutional Professional Consult (permissible substitution): Payer: Self-pay | Admitting: Pediatrics

## 2017-11-05 DIAGNOSIS — D649 Anemia, unspecified: Secondary | ICD-10-CM | POA: Diagnosis not present

## 2017-11-05 LAB — COMPREHENSIVE METABOLIC PANEL
ALK PHOS: 292 U/L — AB (ref 38–126)
ALT: 129 U/L — AB (ref 14–54)
AST: 79 U/L — ABNORMAL HIGH (ref 15–41)
Albumin: 2.5 g/dL — ABNORMAL LOW (ref 3.5–5.0)
Anion gap: 9 (ref 5–15)
BUN: 13 mg/dL (ref 6–20)
CHLORIDE: 105 mmol/L (ref 101–111)
CO2: 20 mmol/L — ABNORMAL LOW (ref 22–32)
CREATININE: 0.99 mg/dL (ref 0.44–1.00)
Calcium: 8.4 mg/dL — ABNORMAL LOW (ref 8.9–10.3)
GFR calc Af Amer: >60 mL/min (ref >60)
Glucose, Bld: 78 mg/dL (ref 65–99)
Potassium: 4.2 mmol/L (ref 3.5–5.1)
Sodium: 134 mmol/L — ABNORMAL LOW (ref 135–145)
Total Bilirubin: 0.2 mg/dL — ABNORMAL LOW (ref 0.3–1.2)
Total Protein: 6.3 g/dL — ABNORMAL LOW (ref 6.5–8.1)

## 2017-11-05 LAB — CBC
HCT: 23.4 % — ABNORMAL LOW (ref 36.0–46.0)
HEMOGLOBIN: 7.7 g/dL — AB (ref 12.0–15.0)
MCH: 23.4 pg — ABNORMAL LOW (ref 26.0–34.0)
MCHC: 32.9 g/dL (ref 30.0–36.0)
MCV: 71.1 fL — AB (ref 78.0–100.0)
PLATELETS: 145 10*3/uL — AB (ref 150–400)
RBC: 3.29 MIL/uL — AB (ref 3.87–5.11)
RDW: 17.8 % — ABNORMAL HIGH (ref 11.5–15.5)
WBC: 15 10*3/uL — ABNORMAL HIGH (ref 4.0–10.5)

## 2017-11-05 MED ORDER — ACETAMINOPHEN 325 MG PO TABS: 650.0000 mg | ORAL_TABLET | ORAL | 0 refills | Status: DC | PRN

## 2017-11-05 MED ORDER — IBUPROFEN 600 MG PO TABS: 600.0000 mg | ORAL_TABLET | Freq: Four times a day (QID) | ORAL | 0 refills | Status: DC | PRN

## 2017-11-05 MED ORDER — FERROUS SULFATE 325 (65 FE) MG PO TABS: 325.0000 mg | ORAL_TABLET | Freq: Two times a day (BID) | ORAL | 0 refills | Status: DC

## 2017-11-05 MED ORDER — PROPOFOL 10 MG/ML IV BOLUS
INTRAVENOUS | Status: AC
  Filled 2017-11-05: qty 20

## 2017-11-05 MED ORDER — POLYETHYLENE GLYCOL 3350 17 G PO PACK: 17.0000 g | PACK | Freq: Every day | ORAL | 1 refills | Status: DC

## 2017-11-05 MED ORDER — DEXAMETHASONE SODIUM PHOSPHATE 4 MG/ML IJ SOLN
INTRAMUSCULAR | Status: AC
  Filled 2017-11-05: qty 1

## 2017-11-05 MED ORDER — PHENYLEPHRINE-MINERAL OIL-PET 0.25-14-74.9 % RE OINT: 1.0000 "application " | TOPICAL_OINTMENT | Freq: Two times a day (BID) | RECTAL | 2 refills | Status: AC | PRN

## 2017-11-05 MED ORDER — ONDANSETRON HCL 4 MG/2ML IJ SOLN
INTRAMUSCULAR | Status: AC
  Filled 2017-11-05: qty 2

## 2017-11-05 NOTE — Discharge Instructions (Signed)
Vaginal Delivery, Care After Refer to this sheet in the next few weeks. These discharge instructions provide you with information on caring for yourself after delivery. Your caregiver may also give you specific instructions. Your treatment has been planned according to the most current medical practices available, but problems sometimes occur. Call your caregiver if you have any problems or questions after you go home. HOME CARE INSTRUCTIONS 1. Take over-the-counter or prescription medicines only as directed by your caregiver or pharmacist. 2. Do not drink alcohol, especially if you are breastfeeding or taking medicine to relieve pain. 3. Do not smoke tobacco. 4. Continue to use good perineal care. Good perineal care includes: 1. Wiping your perineum from back to front 2. Keeping your perineum clean. 3. You can do sitz baths twice a day, to help keep this area clean 5. Do not use tampons, douche or have sex until your caregiver says it is okay. 6. Shower only and avoid sitting in submerged water, aside from sitz baths 7. Wear a well-fitting bra that provides breast support. 8. Eat healthy foods. 9. Drink enough fluids to keep your urine clear or pale yellow. 10. Eat high-fiber foods such as whole grain cereals and breads, brown rice, beans, and fresh fruits and vegetables every day. These foods may help prevent or relieve constipation. 11. Avoid constipation with high fiber foods or medications, such as miralax or metamucil 12. Follow your caregiver's recommendations regarding resumption of activities such as climbing stairs, driving, lifting, exercising, or traveling. 13. Talk to your caregiver about resuming sexual activities. Resumption of sexual activities is dependent upon your risk of infection, your rate of healing, and your comfort and desire to resume sexual activity. 14. Try to have someone help you with your household activities and your newborn for at least a few days after you leave  the hospital. 15. Rest as much as possible. Try to rest or take a nap when your newborn is sleeping. 16. Increase your activities gradually. 17. Keep all of your scheduled postpartum appointments. It is very important to keep your scheduled follow-up appointments. At these appointments, your caregiver will be checking to make sure that you are healing physically and emotionally. SEEK MEDICAL CARE IF:   You are passing large clots from your vagina. Save any clots to show your caregiver.  You have a foul smelling discharge from your vagina.  You have trouble urinating.  You are urinating frequently.  You have pain when you urinate.  You have a change in your bowel movements.  You have increasing redness, pain, or swelling near your vaginal incision (episiotomy) or vaginal tear.  You have pus draining from your episiotomy or vaginal tear.  Your episiotomy or vaginal tear is separating.  You have painful, hard, or reddened breasts.  You have a severe headache.  You have blurred vision or see spots.  You feel sad or depressed.  You have thoughts of hurting yourself or your newborn.  You have questions about your care, the care of your newborn, or medicines.  You are dizzy or light-headed.  You have a rash.  You have nausea or vomiting.  You were breastfeeding and have not had a menstrual period within 12 weeks after you stopped breastfeeding.  You are not breastfeeding and have not had a menstrual period by the 12th week after delivery.  You have a fever. SEEK IMMEDIATE MEDICAL CARE IF:   You have persistent pain.  You have chest pain.  You have shortness of breath.    You faint.  You have leg pain.  You have stomach pain.  Your vaginal bleeding saturates two or more sanitary pads in 1 hour. MAKE SURE YOU:   Understand these instructions.  Will watch your condition.  Will get help right away if you are not doing well or get worse. Document Released:  05/24/2000 Document Revised: 10/11/2013 Document Reviewed: 01/22/2012 ExitCare Patient Information 2015 [employer]. This information is not intended to replace advice given to you by your health care provider. Make sure you discuss any questions you have with your health care provider.  Sitz Bath A sitz bath is a warm water bath taken in the sitting position. The water covers only the hips and butt (buttocks). We recommend using one that fits in the toilet, to help with ease of use and cleanliness. It may be used for either healing or cleaning purposes. Sitz baths are also used to relieve pain, itching, or muscle tightening (spasms). The water may contain medicine. Moist heat will help you heal and relax.  HOME CARE  Take 3 to 4 sitz baths a day. 18. Fill the bathtub half-full with warm water. 19. Sit in the water and open the drain a little. 20. Turn on the warm water to keep the tub half-full. Keep the water running constantly. 21. Soak in the water for 15 to 20 minutes. 22. After the sitz bath, pat the affected area dry. GET HELP RIGHT AWAY IF: You get worse instead of better. Stop the sitz baths if you get worse. MAKE SURE YOU:  Understand these instructions.  Will watch your condition.  Will get help right away if you are not doing well or get worse. Document Released: 07/04/2004 Document Revised: 02/19/2012 Document Reviewed: 09/24/2010 ExitCare Patient Information 2015 [employer]. This information is not intended to replace advice given to you by your health care provider. Make sure you discuss any questions you have with your health care provider.    

## 2017-11-05 NOTE — Progress Notes (Signed)
Pt Sleeping

## 2017-11-05 NOTE — Progress Notes (Signed)
Discharge teaching complete with pt. Pt understood all information and did not have any questions. Pt discharged home to family. 

## 2017-11-05 NOTE — Lactation Note (Signed)
This note was copied from a baby's chart. Lactation Consultation Note  Patient Name: Sharon Harvey IYMEB'R Date: 11/05/2017 Reason for consult: Follow-up assessment;Early term 37-38.6wks;Infant weight loss(6% weight loss )  Baby is 40 hours old, As LC entered the room , per mom baby recently fed 35 mins.  Baby awake and alert. LC and orentee offered to assist with latch  And worked on depth and positioning, swallows noted.  Mom denied soreness, sore nipple and engorgement reviewed.  Instructed mom on the use hand pump , #24 F good fit today and #27 given for  When milk comes in.  Mother informed of post-discharge support and given phone number to the lactation department, including services for phone call assistance; out-patient appointments; and breastfeeding support group. List of other breastfeeding resources in the community given in the handout. Encouraged mother to call for problems or concerns related to breastfeeding.     Maternal Data    Feeding Feeding Type: Breast Fed Length of feed: (Swallows noted - still feeding at 10 mins)  LATCH Score Latch: Repeated attempts needed to sustain latch, nipple held in mouth throughout feeding, stimulation needed to elicit sucking reflex.  Audible Swallowing: Spontaneous and intermittent  Type of Nipple: Everted at rest and after stimulation  Comfort (Breast/Nipple): Soft / non-tender  Hold (Positioning): Assistance needed to correctly position infant at breast and maintain latch.  LATCH Score: 8  Interventions Interventions: Breast feeding basics reviewed;Assisted with latch;Skin to skin;Breast massage;Hand express;Breast compression;Adjust position;Support pillows;Position options;Hand pump(Hand pump given to Mother)  Lactation Tools Discussed/Used Tools: Pump;Flanges Flange Size: 24;27 Breast pump type: Manual Pump Review: Setup, frequency, and cleaning Initiated by:: MAI (and orientee ) Date initiated::  11/05/17   Consult Status      Sharon Harvey 11/05/2017, 9:37 AM

## 2017-11-05 NOTE — Discharge Summary (Signed)
Physician Obstetric Discharge Summary  Patient ID: Sharon Harvey MRN: [id number] DOB/AGE: 42-Feb-1977 42 y.o.   [facility]  Date of Admission: 11/02/2017  Date of Discharge: 11/05/2017  Admitting Diagnosis: early labor at [redacted]w[redacted]d, pre-eclampsia with severe features (BP) superimposed on cHTN  Secondary Diagnosis: TOLAC, AMA  Discharge Diagnosis: HELLP, s/p VBAC  Date of Delivery: 11/03/2017  Delivered by: Crissie Figures MD  Mode of Delivery: VBAC/2nd degree, sulcal and periuretheral   Anesthesia: epidural  Postpartum procedures: None  Complications: HELLP  Chase Atkins is a [id number] who had a VBAC.   for further details of this delivery, please refer to the delivery note. Patient developed HELLP during her labor course. She was on 24hr of PP Mg and her labs improved during that time.  By time of discharge on PPD#2, her pain was controlled on oral pain medications; she had appropriate lochia and was ambulating, voiding without difficulty and tolerating regular diet.  She was deemed stable for discharge to home.  She was not discharged to home on any BP medications.   Labs: CBC Latest Ref Rng & Units 11/05/2017 11/04/2017 11/03/2017  WBC 4.0 - 10.5 K/uL 15.0(H) 13.8(H) 12.5(H)  Hemoglobin 12.0 - 15.0 g/dL 7.7(L) 8.2(L) 9.1(L)  Hematocrit 36.0 - 46.0 % 23.4(L) 24.7(L) 28.1(L)  Platelets 150 - 400 K/uL 145(L) 109(L) 102(L)    --/--/O POS, O POS Performed at [facility], [address] [place], [place] [address]  [phone])  Physical exam:   Current Vital Signs 24h Vital Sign Ranges  T 98.5 F (36.9 C) Temp  Avg: 98.6 F (37 C)  Min: 98.4 F (36.9 C)  Max: 98.8 F (37.1 C)  BP 122/68 BP  Min: 110/63  Max: 132/55  HR 82 Pulse  Avg: 85.3  Min: 82  Max: 89  RR 18 Resp  Avg: 17.9  Min: 16  Max: 19  SaO2 100 % Room Air SpO2  Avg: 100 %  Min: 100 %  Max: 100 %       24 Hour I/O Current Shift I/O   Time Ins Outs 05/28 0701 - 05/29 0700 In: 7846 [P.O.:4018; I.V.:1250] Out: 5475 [NGEXB:2841] 05/29 0701 - 05/29 1900 In: -  Out: 1000 [Urine:1000]    Patient Vitals for the past 6 hrs:  BP Temp Temp src Pulse Resp SpO2  11/05/17 0755 122/68 98.5 F (36.9 C) Oral 82 18 100 %  11/05/17 0441 (!) 132/55 98.5 F (36.9 C) Oral 89 18 100 %    General: Well nourished, well developed female in no acute distress. GU: deferred, normal looking uop Abdomen: nttp, firm fundus below the umbilicus Cardiovascular: S1, S2 normal, no murmur, rub or gallop, regular rate and rhythm Respiratory: CTAB Extremities: no clubbing, cyanosis or edema Skin: Warm and dry.   Discharge Instructions: Per After Visit Summary. Activity: Advance as tolerated. Pelvic rest for 6 weeks.  Also refer to After Visit Summary Diet: Regular Postpartum contraception: patient unsure Discharge Medications:  Allergies as of 11/05/2017   No Known Allergies     Medication List    TAKE these medications   acetaminophen 325 MG tablet Commonly known as:  TYLENOL Take 2 tablets (650 mg total) by mouth every 4 (four) hours as needed (for pain scale < 4).   ferrous sulfate 325 (65 FE) MG tablet Take 1 tablet (325 mg total) by mouth 2 (two) times daily with a meal.   ibuprofen 600 MG tablet Commonly known  as:  ADVIL,MOTRIN Take 1 tablet (600 mg total) by mouth every 6 (six) hours as needed for mild pain or cramping.   phenylephrine-shark liver oil-mineral oil-petrolatum 0.25-14-74.9 % rectal ointment Commonly known as:  PREPARATION H Place 1 application rectally 2 (two) times daily as needed for hemorrhoids.   polyethylene glycol packet Commonly known as:  MIRALAX Take 17 g by mouth daily.   prenatal multivitamin Tabs tablet Take 1 tablet by mouth daily at 12 noon.       Discharged Condition: Stable Discharged to: Home Outpatient follow up:  Follow-up [place] [facility]. Go  in 1 week(s).   Specialty:  Obstetrics and Gynecology Contact information: [address] [place] [phone]        11/12/2017 @ 1330  Newborn Data: APGAR (1 MIN): 5   APGAR (5 MINS): 8   APGAR (10 MINS):   Baby Weight: 3235gm Baby Feeding: Breast Disposition:home with mother   Durene Romans MD Attending Center for [employer] [facility])

## 2017-11-10 ENCOUNTER — Ambulatory Visit: Payer: Medicaid Other

## 2017-11-12 ENCOUNTER — Ambulatory Visit (INDEPENDENT_AMBULATORY_CARE_PROVIDER_SITE_OTHER): Payer: Medicaid Other | Admitting: General Practice

## 2017-11-12 VITALS — BP 130/78 | HR 85 | Ht 64.0 in | Wt 207.0 lb

## 2017-11-12 DIAGNOSIS — Z0130 Encounter for examination of blood pressure without abnormal findings: Secondary | ICD-10-CM

## 2017-11-12 NOTE — Progress Notes (Signed)
Patient presents to office today for BP check. Patient delivered vaginally 1.5 weeks ago. Patient denies headaches or blurry vision. Reports occasional dizziness. Patient was not prescribed BP medication after hospital discharge. BP is WNL today. Patient has PP visit scheduled 7/12- will follow up then.

## 2017-11-12 NOTE — Progress Notes (Signed)
I have reviewed this chart and agree with the RN/CMA assessment and management.    K. Meryl Zamarion Longest, M.D. Attending Obstetrician & Gynecologist, [facility], [facility]  

## 2017-12-19 ENCOUNTER — Ambulatory Visit: Payer: Medicaid Other | Admitting: Obstetrics and Gynecology

## 2018-01-15 ENCOUNTER — Ambulatory Visit: Payer: Medicaid Other | Admitting: Student

## 2018-01-19 ENCOUNTER — Encounter: Payer: Self-pay | Admitting: Student

## 2018-01-20 ENCOUNTER — Ambulatory Visit: Payer: Medicaid Other | Admitting: Student

## 2018-06-15 ENCOUNTER — Ambulatory Visit (INDEPENDENT_AMBULATORY_CARE_PROVIDER_SITE_OTHER): Payer: Medicaid Other | Admitting: Obstetrics and Gynecology

## 2018-06-15 ENCOUNTER — Encounter: Payer: Self-pay | Admitting: Obstetrics and Gynecology

## 2018-06-15 VITALS — BP 134/93 | HR 76 | Wt 205.0 lb

## 2018-06-15 DIAGNOSIS — Z0000 Encounter for general adult medical examination without abnormal findings: Secondary | ICD-10-CM | POA: Insufficient documentation

## 2018-06-15 DIAGNOSIS — Z1231 Encounter for screening mammogram for malignant neoplasm of breast: Secondary | ICD-10-CM

## 2018-06-15 NOTE — Patient Instructions (Signed)
[facility] [place] [address], [address] [place], [place]  [address] Phone - [phone]   Fax - [phone]  [facility] [place] [address] [place], [place] [address] Phone - [phone]   Fax - [place] [address] [place], [place]  [address] Phone - [phone]   Fax - [phone]  [place]. [address] [place] [address] [place], [place]  [address] Phone - [phone]   Fax - [phone]  [place] [address] [place], [place]  [address] Phone - [phone]   Fax - [phone]  [facility] [address], [address] [place], [place]  [address] Phone - [phone]   Fax - [place] [address], [place], [place]  [address] Phone - [phone]   Fax - [phone]  [place] [address] [place], [place] [address] [place], [place]  [address] Phone - [phone]   Fax - [place] [address] [place], [place]  [address] Phone - [phone]   Fax - [phone] [place]. [address] [place], [place]  [address] Phone - [phone]   Fax - [phone]  [place] [address] [place], [place]  [address] Phone - [phone]   Fax - [phone]  [facility] AT [place], [place], [place]  [address] Phone - [phone]   Fax - [place] [address], [place], [place]  [address] Phone - [phone]   Fax - [phone]  [facility] [address] [place], [place]  [address] Phone - [place], [place]  [address] Phone - [phone]   Fax - [place] [address], [place], [place]  [address] Phone - [phone]   Fax - [phone]  [place] [address] [place], [place]  [address] Phone - [phone]   Fax - [place]. [place], [place]  [address] Phone - [phone]   Fax - [place], [place], [place]  [address] Phone - [phone]   Fax - [place] [address], [place], [place]  [address] Phone - [phone]   Fax - [phone]  [place] [address], [place], [place]  [address] Phone - [phone]   Fax - [place] [address] [place], [place]  [address] Phone - [phone]   Fax - [phone]  [place] [address] [place],   [address] Phone - [phone]   Fax - [phone] [place] [address] [place],   [address] Phone - [phone]   Fax - [phone]  [place] [address] [place],   [address] Phone - [phone]   Fax - [place] [address], [place],   [address] Phone - [phone]   Fax - [phone]  [place] MD [address] [place] [address] Phone [phone]  Fax [phone]      Primary Care Resources:  - [facility]  [address]  [place] [address] Ph [phone] Every 2nd Saturday 9am-12pm  [email or web address] FREE Services  - [facility]  [address] [place] Ph [phone]  [place] Ph [phone]  [email or web address] $45 per visit/Walk-in only  - [facility]  [place] [address] Ph [phone]  1st & 3rd Saturday of each month  9:30am-12:30pm [email or web address] Sliding fee scale/Call to make an appointment  - [facility]  [address], [place] Ph [phone]  Hours Mon-Fri 9am-7pm & Sat 9am-1pm [email or web address] Visits start at $45 per visit/Call to make an appointment  [facility]  [place] [address] Ph [phone]  Hours Mon-Wed 8:30am-5pm & Thurs 8:30am-8pm $5 per visit/Call for an eligibility appointment

## 2018-06-15 NOTE — Progress Notes (Signed)
Delivered in May 2019 and never came for pp appt. Having some bloating in abd but no other concerns.

## 2018-06-15 NOTE — Progress Notes (Signed)
Subjective:   Sharon Harvey is a 43 y.o. G61P2002 female here for a routine check up.  Current complaints; she was never seen for her PP visit and wants to make sure everything is ok. Denies abnormal vaginal bleeding, discharge, pelvic pain, problems with intercourse or other gynecologic concerns.    Gynecologic History Patient's last menstrual period was 06/03/2018. Contraception: none Last Pap: 2018. Results were: negative HPV Last mammogram: Never.   Obstetric History OB History  Gravida Para Term Preterm AB Living  2 2 2     2   SAB TAB Ectopic Multiple Live Births        0 2    # Outcome Date GA Lbr Len/2nd Weight Sex Delivery Anes PTL Lv  2 Term 11/03/17 [redacted]w[redacted]d 25:39 / 00:22 7 lb 2.1 oz (3.235 kg) M Vag-Spont EPI  LIV  1 Term 09/28/11 [redacted]w[redacted]d   F CS-LTranv Spinal N LIV    Past Medical History:  Diagnosis Date  . Hypertension     Past Surgical History:  Procedure Laterality Date  . CESAREAN SECTION      Current Outpatient Medications on File Prior to Visit  Medication Sig Dispense Refill  . Prenatal Vit-Fe Fumarate-FA (PRENATAL MULTIVITAMIN) TABS tablet Take 1 tablet by mouth daily at 12 noon.    [name] acetaminophen (TYLENOL) 325 MG tablet Take 2 tablets (650 mg total) by mouth every 4 (four) hours as needed (for pain scale < 4). (Patient not taking: Reported on 06/15/2018) 30 tablet 0  . ferrous sulfate 325 (65 FE) MG tablet Take 1 tablet (325 mg total) by mouth 2 (two) times daily with a meal. (Patient not taking: Reported on 06/15/2018) 60 tablet 0  . ibuprofen (ADVIL,MOTRIN) 600 MG tablet Take 1 tablet (600 mg total) by mouth every 6 (six) hours as needed for mild pain or cramping. (Patient not taking: Reported on 06/15/2018) 30 tablet 0  . phenylephrine-shark liver oil-mineral oil-petrolatum (PREPARATION H) 0.25-14-74.9 % rectal ointment Place 1 application rectally 2 (two) times daily as needed for hemorrhoids. (Patient not taking: Reported on 06/15/2018) 1 Tube 2  .  polyethylene glycol (MIRALAX) packet Take 17 g by mouth daily. (Patient not taking: Reported on 06/15/2018) 28 each 1   No current facility-administered medications on file prior to visit.     No Known Allergies  Social History:  reports that she has never smoked. She has never used smokeless tobacco. She reports that she does not drink alcohol or use drugs.  Family History  Problem Relation Age of Onset  . ADD / ADHD Neg Hx   . Anxiety disorder Neg Hx   . Alcohol abuse Neg Hx   . Arthritis Neg Hx   . Asthma Neg Hx   . Birth defects Neg Hx   . Cancer Neg Hx   . COPD Neg Hx   . Depression Neg Hx   . Diabetes Neg Hx   . Drug abuse Neg Hx   . Early death Neg Hx   . Hearing loss Neg Hx   . Heart disease Neg Hx   . Hyperlipidemia Neg Hx   . Hypertension Neg Hx   . Intellectual disability Neg Hx   . Kidney disease Neg Hx   . Learning disabilities Neg Hx   . Miscarriages / Stillbirths Neg Hx   . Obesity Neg Hx   . Stroke Neg Hx   . Vision loss Neg Hx   . Varicose Veins Neg Hx  The following portions of the patient's history were reviewed and updated as appropriate: allergies, current medications, past family history, past medical history, past social history, past surgical history and problem list.  Review of Systems Pertinent items noted in HPI and remainder of comprehensive ROS otherwise negative.   Objective:  BP (!) 134/93   Pulse 76   Wt 205 lb (93 kg)   LMP 06/03/2018   Breastfeeding Yes   BMI 35.19 kg/m  CONSTITUTIONAL: Well-developed, well-nourished female in no acute distress.  HENT:  Normocephalic, atraumatic, External right and left ear normal. Oropharynx is clear and moist EYES: Conjunctivae and EOM are normal. Pupils are equal, round, and reactive to light. No scleral icterus.  NECK: Normal range of motion, supple, no masses.  Normal thyroid.  SKIN: Skin is warm and dry. No rash noted. Not diaphoretic. No erythema. No pallor. MUSCULOSKELETAL: Normal range  of motion. No tenderness.  No cyanosis, clubbing, or edema.  2+ distal pulses. NEUROLOGIC: Alert and oriented to person, place, and time. Normal reflexes, muscle tone coordination. No cranial nerve deficit noted. PSYCHIATRIC: Normal mood and affect. Normal behavior. Normal judgment and thought content. CARDIOVASCULAR: Normal heart rate noted, regular rhythm RESPIRATORY: Clear to auscultation bilaterally. Effort and breath sounds normal, no problems with respiration noted.  Assessment and Plan:   1. Regular check-up  - dong well - declined BC and flu shot today    2. Encounter for screening mammogram for breast cancer  - MM 3D SCREEN BREAST BILATERAL; Future   Mammogram scheduled Routine preventative health maintenance measures emphasized. Please refer to After Visit Summary for other counseling recommendations.     Corin Tilly, Artist Pais, [facility] for [employer], [place]

## 2018-06-18 NOTE — Addendum Note (Signed)
Addended by: Noni Saupe I on: 06/18/2018 02:08 PM   Modules accepted: Level of Service

## 2018-07-15 ENCOUNTER — Encounter: Payer: Self-pay | Admitting: Radiology

## 2018-07-15 ENCOUNTER — Ambulatory Visit
Admission: RE | Admit: 2018-07-15 | Discharge: 2018-07-15 | Disposition: A | Discharge status: Home / Self Care | Payer: Medicaid Other | Source: Ambulatory Visit | Attending: Obstetrics and Gynecology | Admitting: Obstetrics and Gynecology

## 2018-07-15 DIAGNOSIS — Z1231 Encounter for screening mammogram for malignant neoplasm of breast: Secondary | ICD-10-CM | POA: Diagnosis not present

## 2018-07-16 ENCOUNTER — Other Ambulatory Visit: Payer: Self-pay | Admitting: Obstetrics and Gynecology

## 2018-07-16 DIAGNOSIS — R928 Other abnormal and inconclusive findings on diagnostic imaging of breast: Secondary | ICD-10-CM

## 2019-01-08 ENCOUNTER — Encounter

## 2019-06-28 IMAGING — US US FETAL BPP W/ NON-STRESS
1 series · 13 of 15 positions shown · non-contrast
Comparison: none

[Series 1: us fetal bpp w/nonstress · 15 acquisitions, 13 frames shown]
[im 1/15]
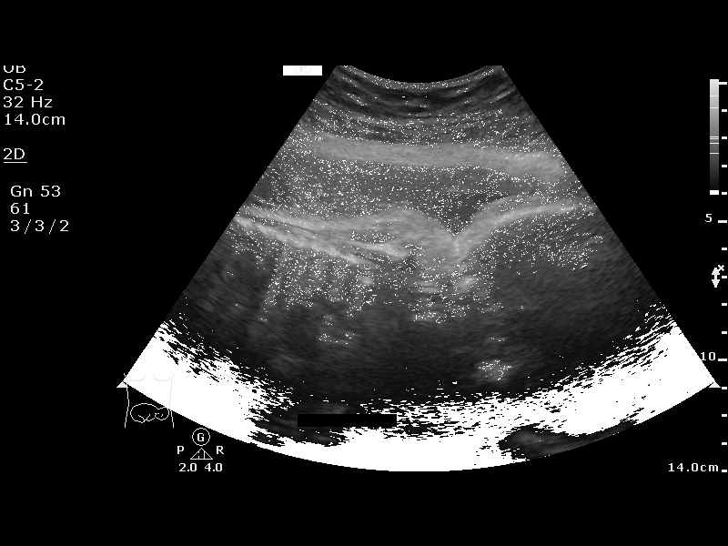
[im 2/15]
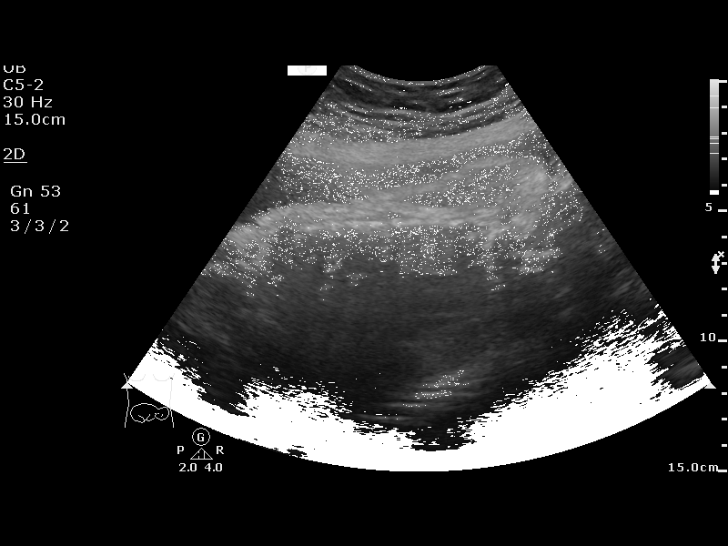
[im 3/15]
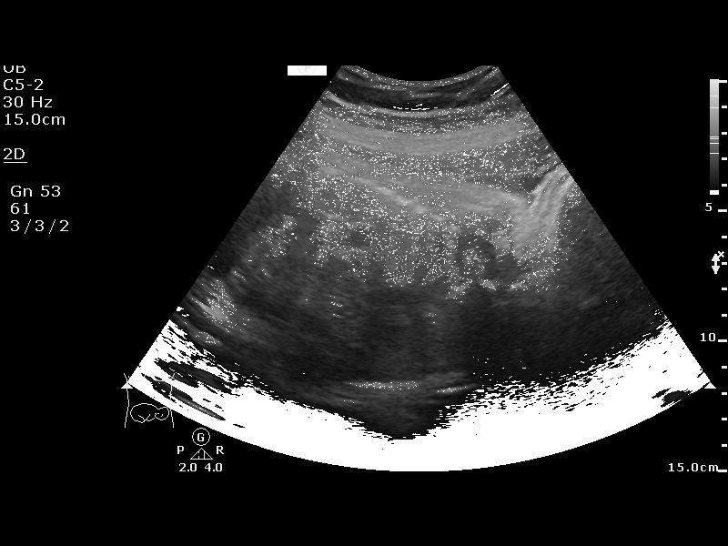
[im 5/15]
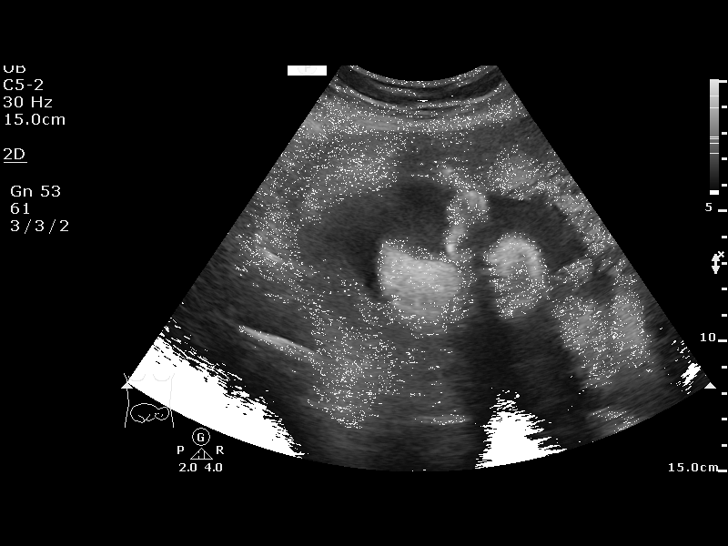
[im 6/15]
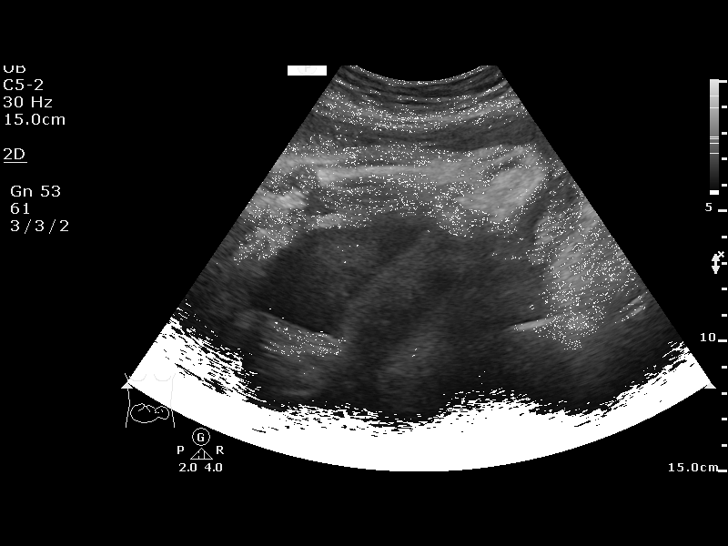
[im 7/15]
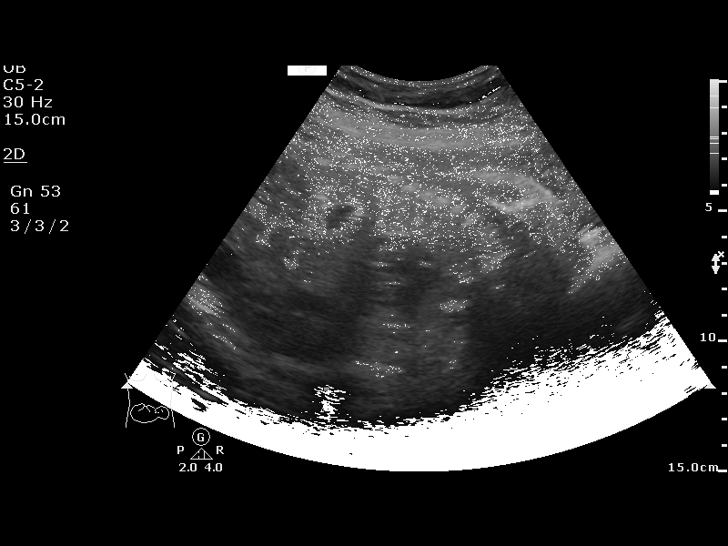
[im 8/15]
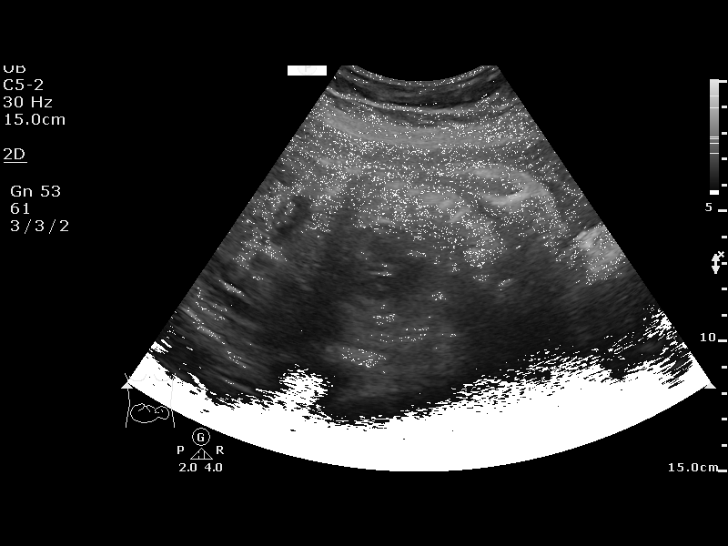
[im 9/15]
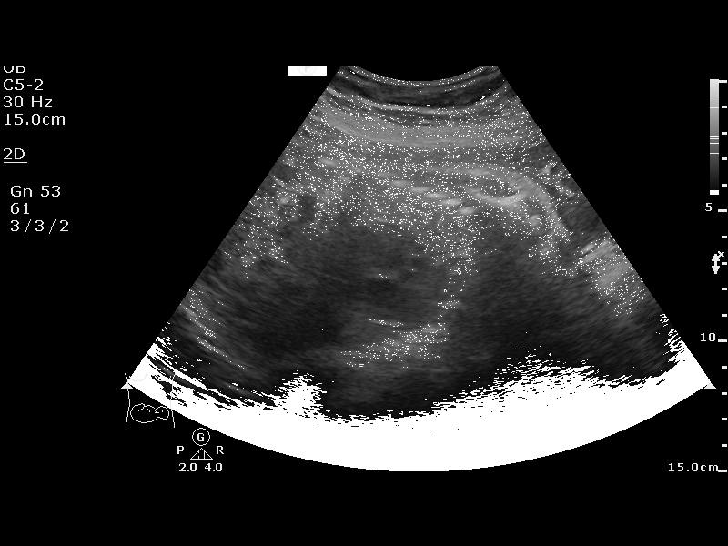
[im 10/15]
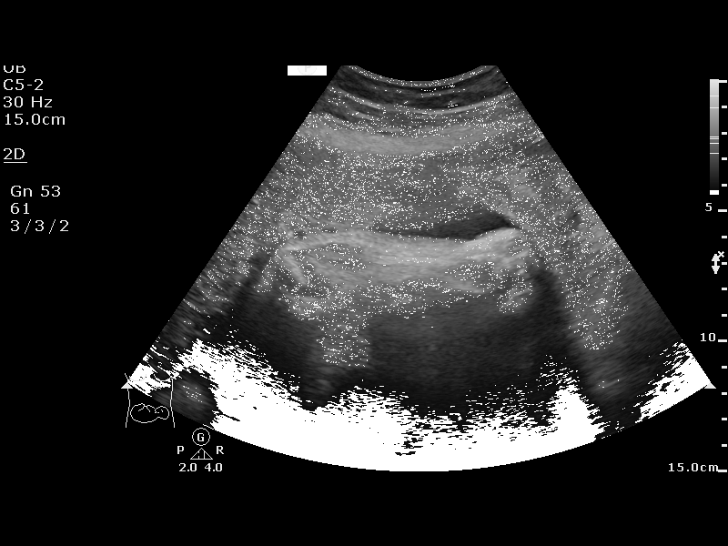
[im 11/15]
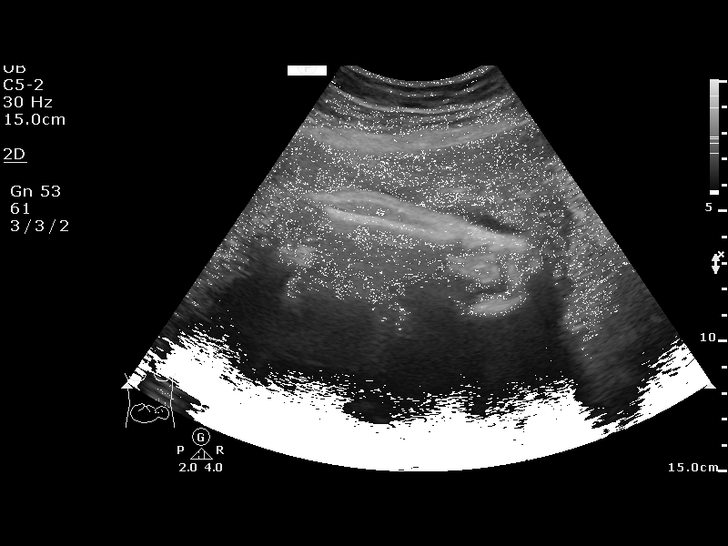
[im 13/15]
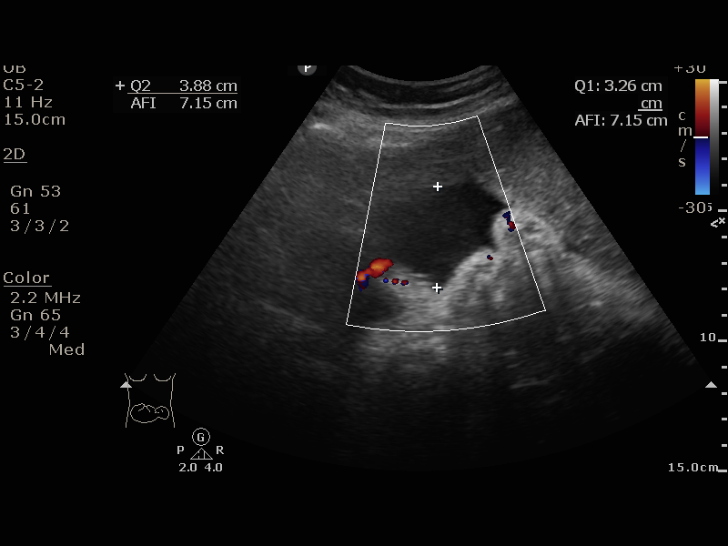
[im 14/15]
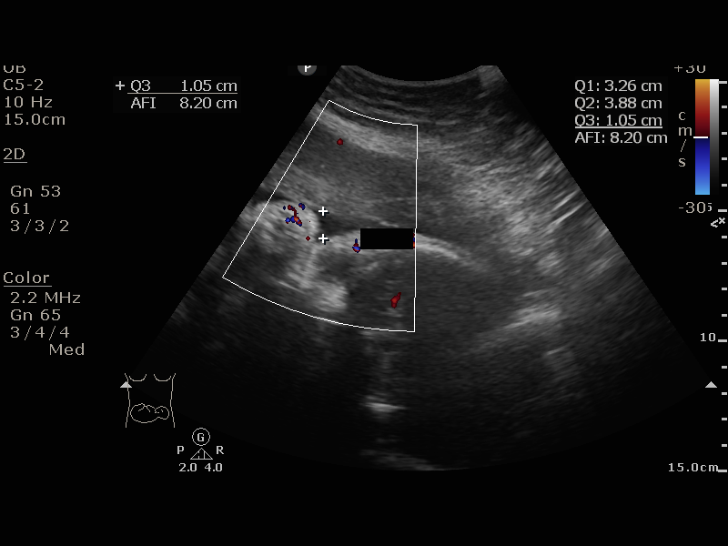
[im 15/15]
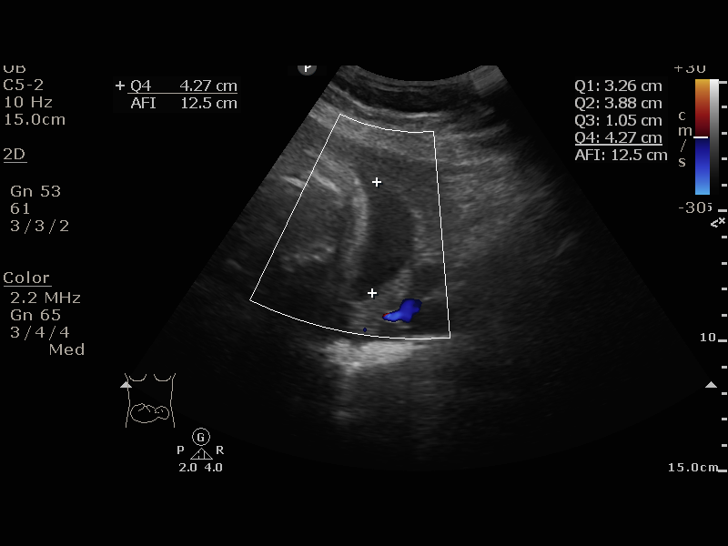

[13 of 15 positions shown; findings below may reference images not displayed]

[facility]
[REDACTED]

1  US FETAL BPP W/NONSTRESS                    76818.4

1  JORGE FEDERICO KIROZ            [id number]      [phone]     [id number]
Service(s) Provided

Indications

32 weeks gestation of pregnancy
Unspecified pre-existing hypertension
complicating pregnancy, third trimester
OB History

Blood Type:            Height:  5'4"   Weight (lb):  208      BMI:
Gravidity:    2         Term:   1        Prem:   0        SAB:   0
TOP:          0       Ectopic:  0        Living: 1
Fetal Evaluation

Num Of Fetuses:     1
Preg. Location:     Intrauterine
Cardiac Activity:   Observed
Presentation:       Transverse, head to maternal left

Amniotic Fluid
AFI FV:      Subjectively within normal limits

AFI Sum(cm)     %Tile       Largest Pocket(cm)
12.46           36

RUQ(cm)       RLQ(cm)       LUQ(cm)        LLQ(cm)
3.26
Biophysical Evaluation

Amniotic F.V:   Pocket => 2 cm two         F. Tone:        Observed
planes
F. Movement:    Observed                   N.S.T:          Nonreactive
F. Breathing:   Observed                   Score:          [DATE]
Gestational Age

LMP:           32w 4d       Date:   02/06/17                 EDD:   11/13/17
Best:          32w 4d    Det. By:   LMP  (02/06/17)          EDD:   11/13/17
Impression

IUP at  21w5d
Normal amniotic fluid volume
Recommendations

Continue recommended antenatal testing

## 2019-07-19 IMAGING — US US FETAL BPP W/ NON-STRESS
1 series · 13 of 14 positions shown · non-contrast
Comparison: none

[Series 1: us fetal bpp w/nonstress · 14 acquisitions, 13 frames shown]
[im 1/14]
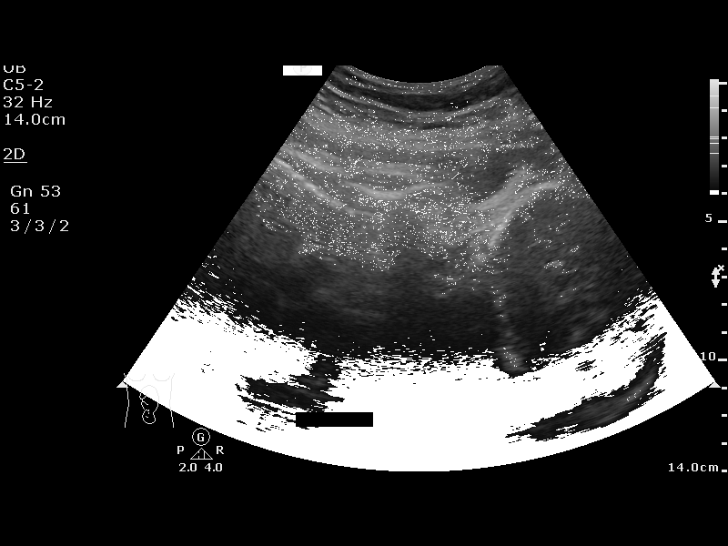
[im 2/14]
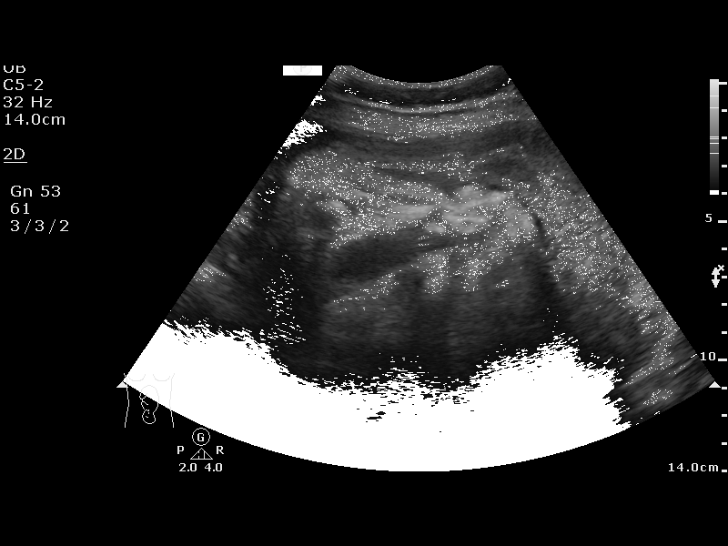
[im 3/14]
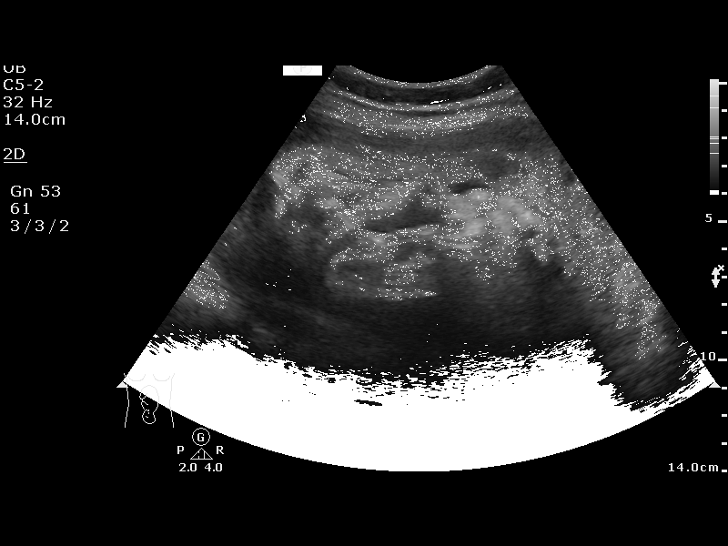
[im 4/14]
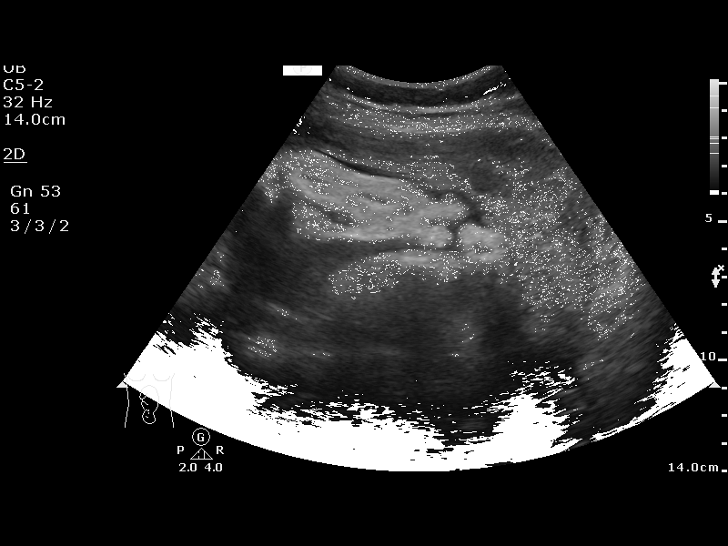
[im 5/14]
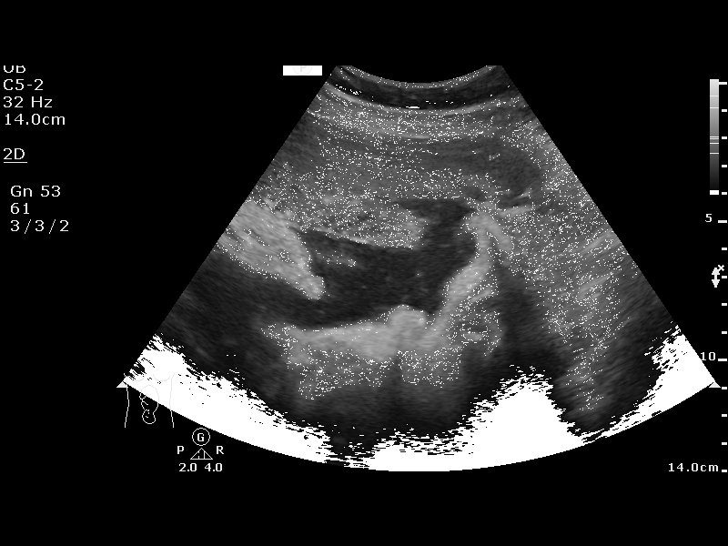
[im 6/14]
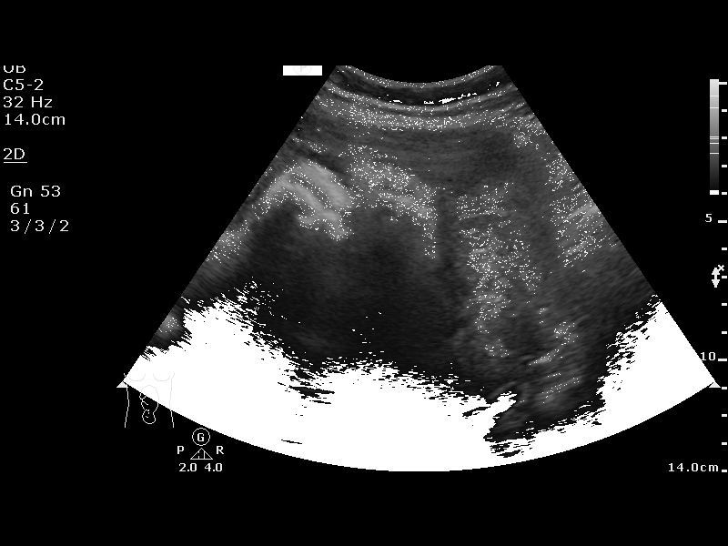
[im 8/14]
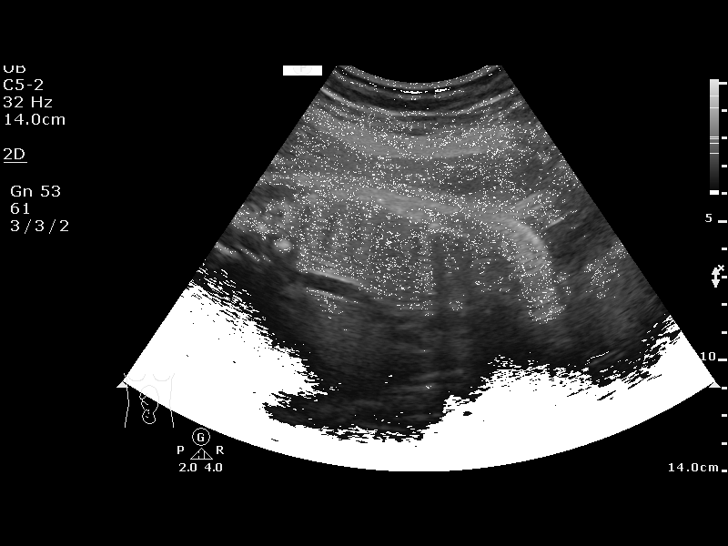
[im 9/14]
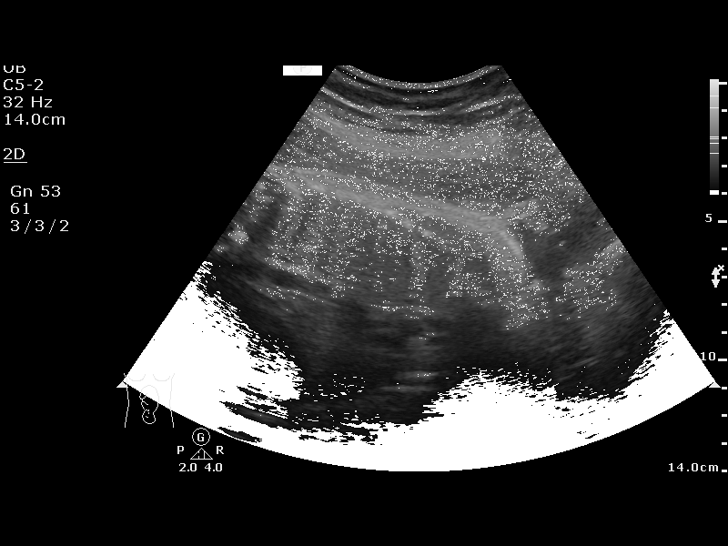
[im 10/14]
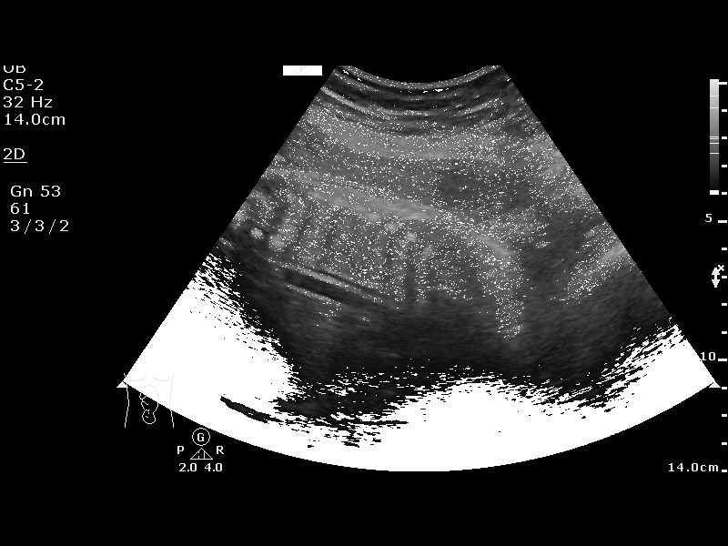
[im 11/14]
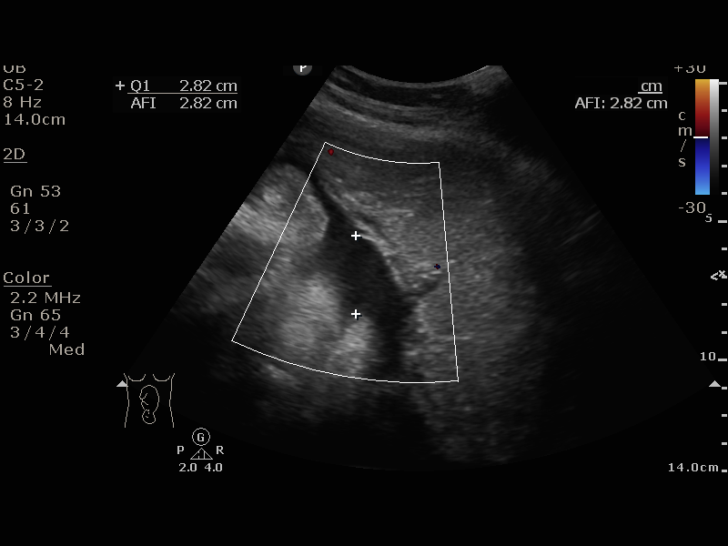
[im 12/14]
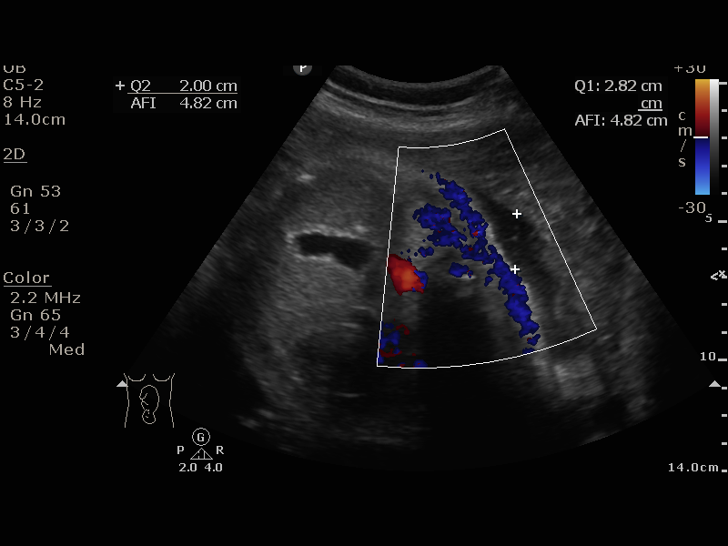
[im 13/14]
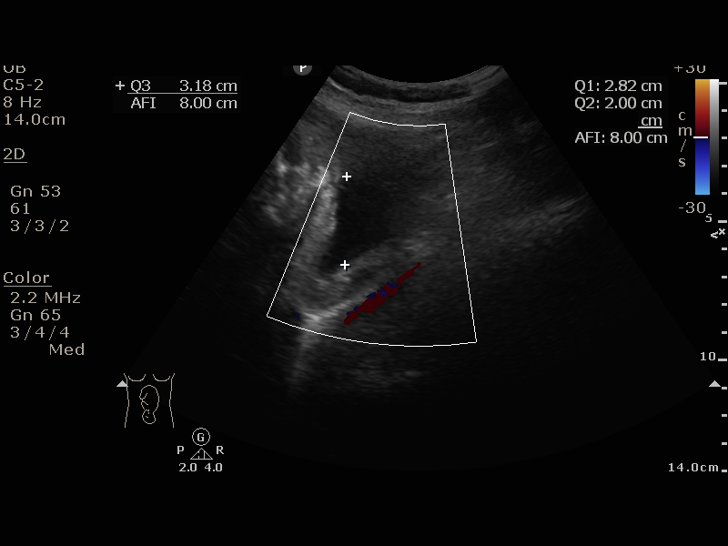
[im 14/14]
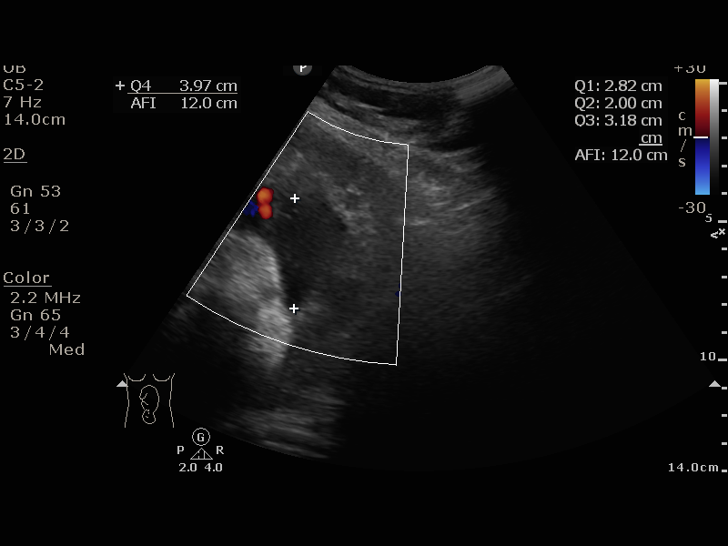

[13 of 14 positions shown; findings below may reference images not displayed]

[facility]
[REDACTED]

1  US FETAL BPP W/NONSTRESS                    76818.4

1  GELY BIGELOW                [id number]      [phone]     [id number]
Service(s) Provided

Indications

35 weeks gestation of pregnancy
Unspecified pre-existing hypertension
complicating pregnancy, third trimester
Advanced maternal age multigravida 35+,
third trimester
OB History

Blood Type:            Height:  5'4"   Weight (lb):  208       BMI:
Gravidity:    2         Term:   1        Prem:   0        SAB:   0
TOP:          0       Ectopic:  0        Living: 1
Fetal Evaluation

Num Of Fetuses:     1
Preg. Location:     Intrauterine
Cardiac Activity:   Observed
Presentation:       Cephalic

Amniotic Fluid
AFI FV:      Subjectively within normal limits

AFI Sum(cm)     %Tile       Largest Pocket(cm)
11.97           36

RUQ(cm)       RLQ(cm)       LUQ(cm)        LLQ(cm)
2.82          3.97          2
Biophysical Evaluation

Amniotic F.V:   Pocket => 2 cm two         F. Tone:        Observed
planes
F. Movement:    Observed                   N.S.T:          Reactive
F. Breathing:   Observed                   Score:          [DATE]
Gestational Age

LMP:           35w 4d        Date:  02/06/17                 EDD:   11/13/17
Best:          35w 4d     Det. By:  LMP  (02/06/17)          EDD:   11/13/17
Impression

CHTN, AMA- reassuring testing
Recommendations

Continue testing until delivery

## 2019-07-26 IMAGING — US US FETAL BPP W/ NON-STRESS
1 series · 13 of 14 positions shown · non-contrast
Comparison: none

[Series 1: us fetal bpp w/nonstress · 14 acquisitions, 13 frames shown]
[im 1/14]
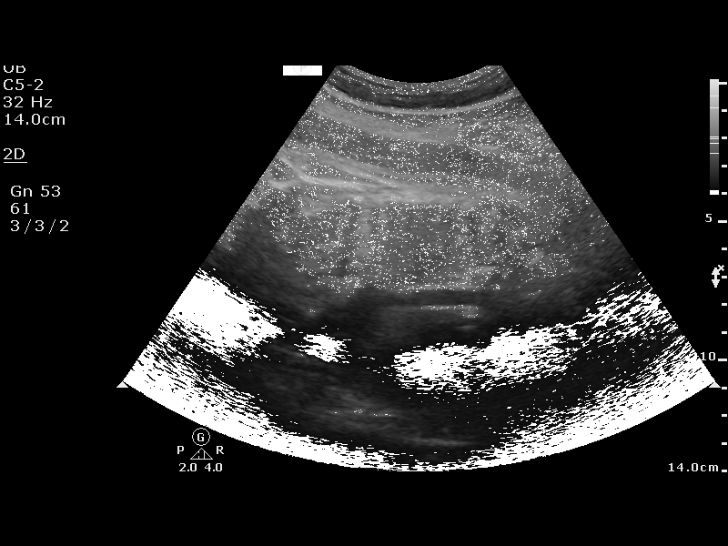
[im 2/14]
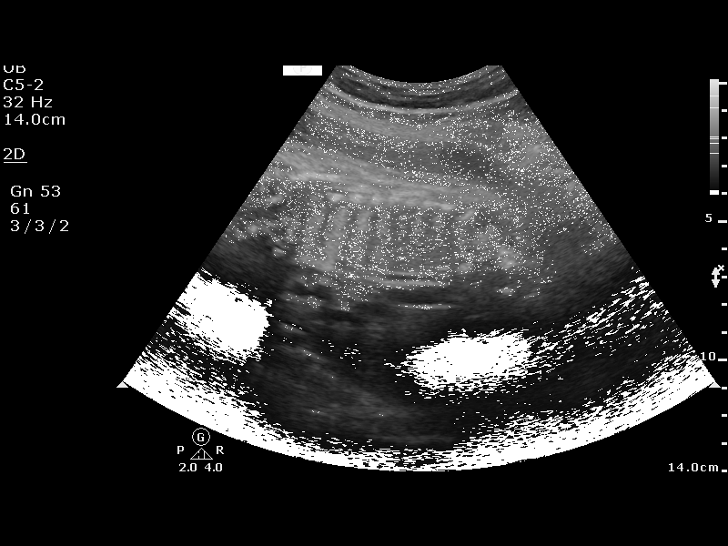
[im 3/14]
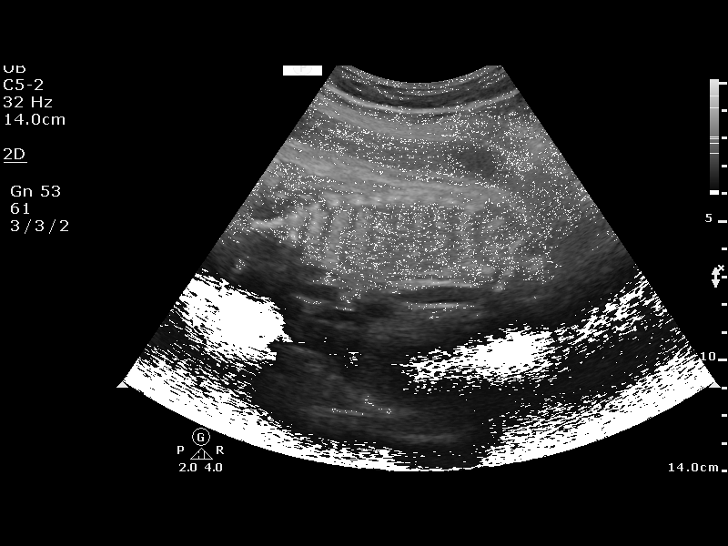
[im 4/14]
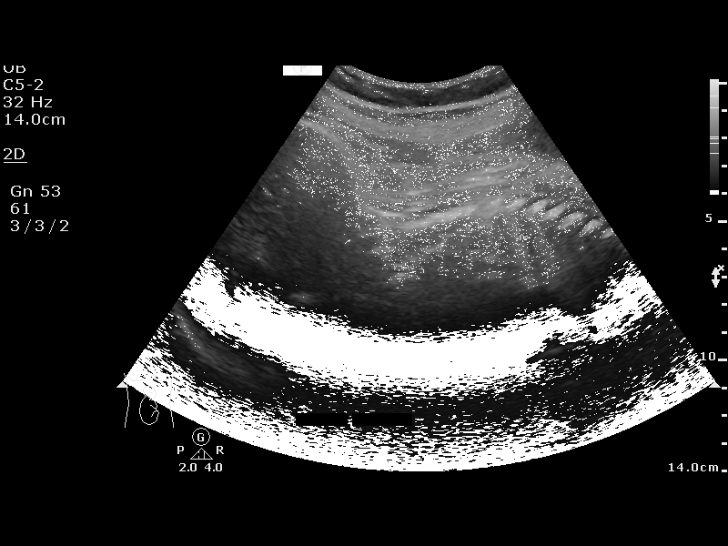
[im 5/14]
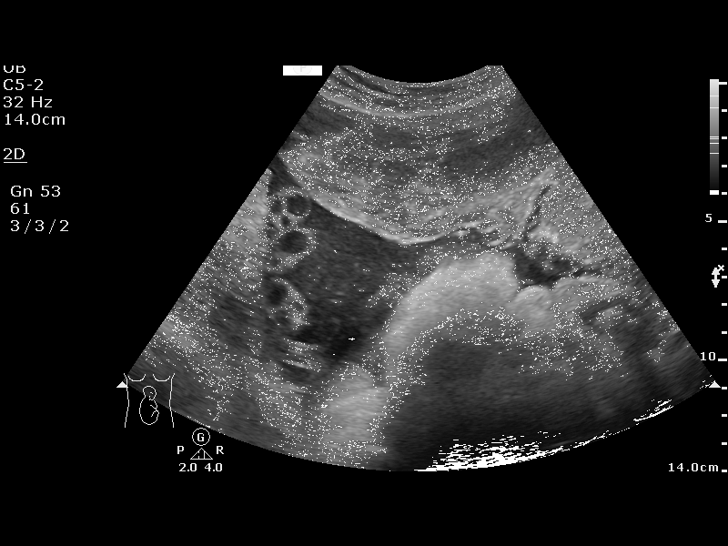
[im 6/14]
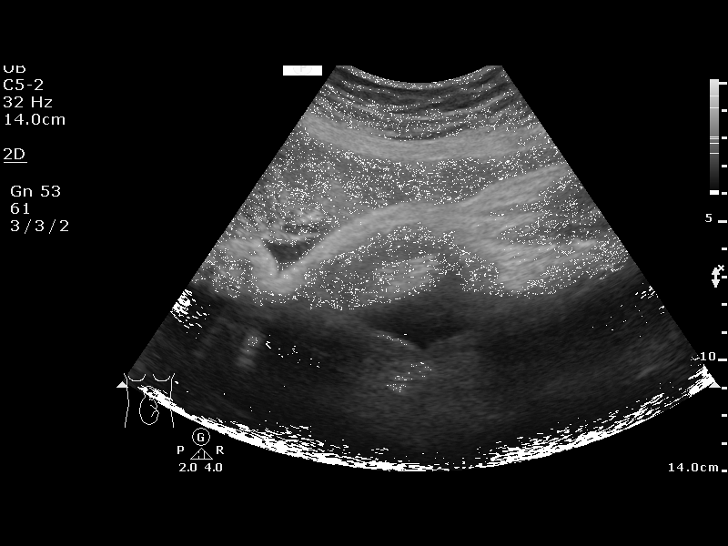
[im 8/14]
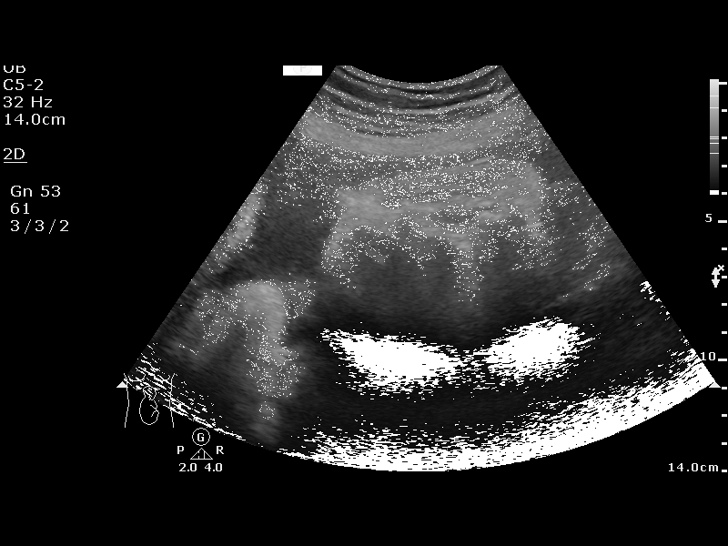
[im 9/14]
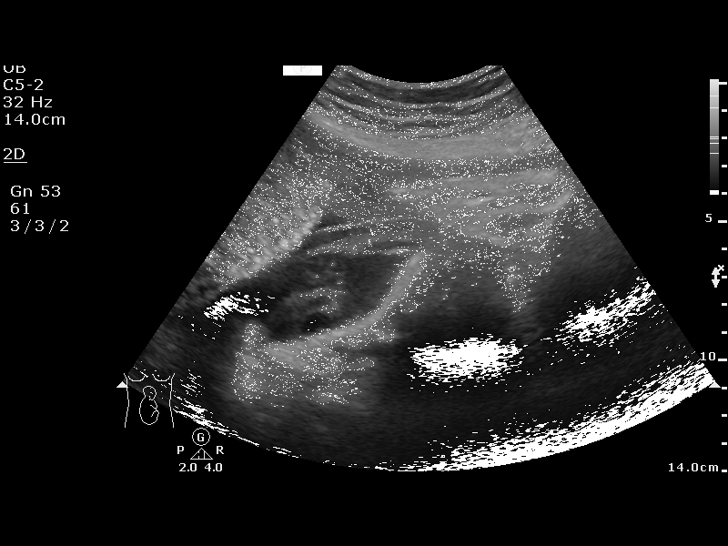
[im 10/14]
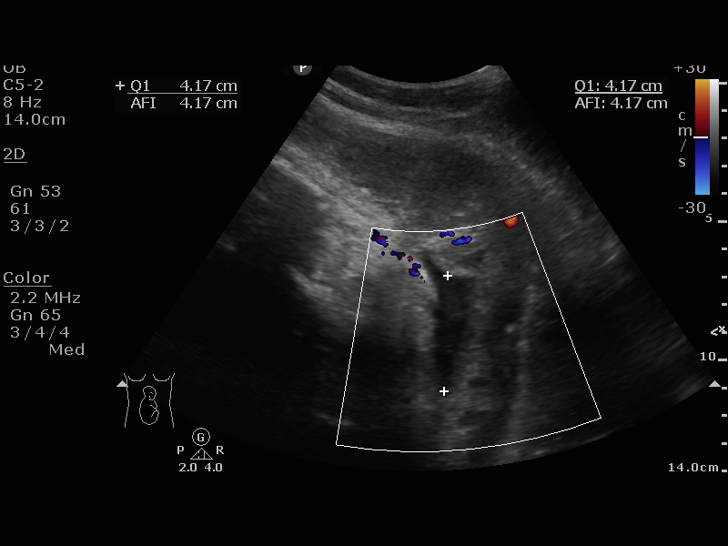
[im 11/14]
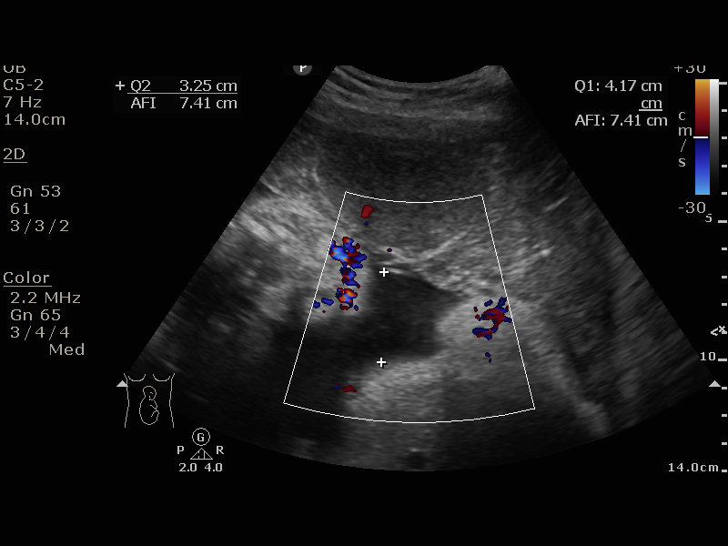
[im 12/14]
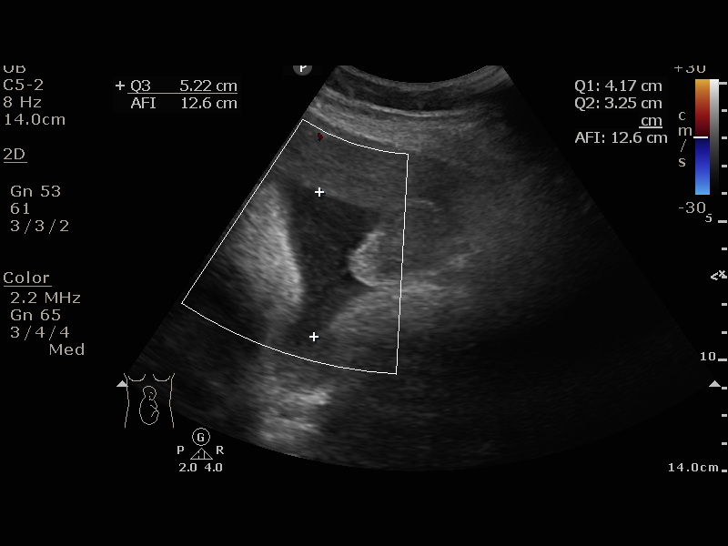
[im 13/14]
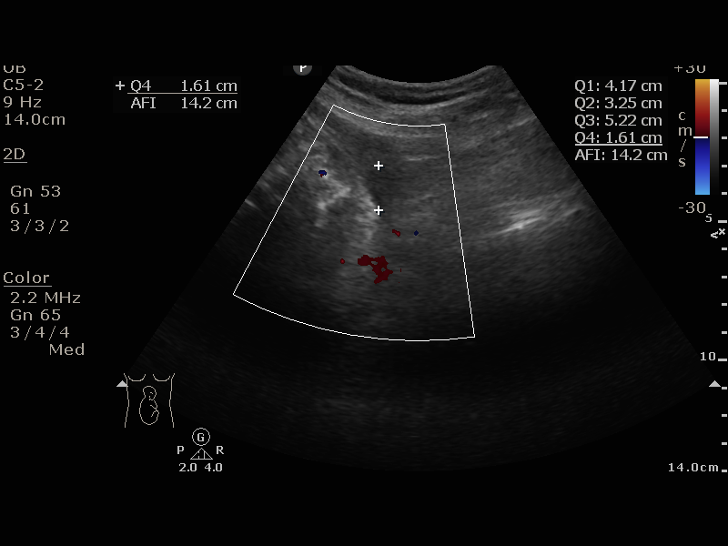
[im 14/14]
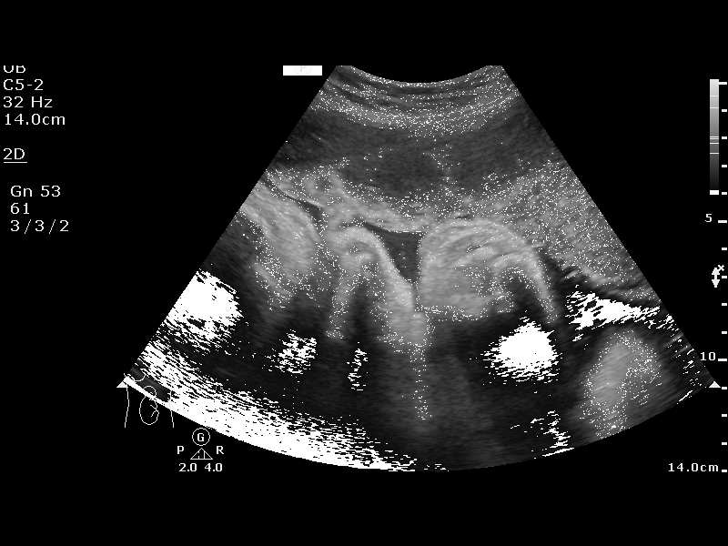

[13 of 14 positions shown; findings below may reference images not displayed]

[facility]
[REDACTED]

1  US FETAL BPP W/NONSTRESS                    76818.4

1  BEIMER BABILON          [id number]      [phone]     [id number]
Service(s) Provided

Indications

36 weeks gestation of pregnancy
Unspecified pre-existing hypertension
complicating pregnancy, third trimester
Advanced maternal age multigravida 35+,
third trimester
OB History

Blood Type:            Height:  5'4"   Weight (lb):  208       BMI:
Gravidity:    2         Term:   1        Prem:   0        SAB:   0
TOP:          0       Ectopic:  0        Living: 1
Fetal Evaluation

Num Of Fetuses:     1
Preg. Location:     Intrauterine
Cardiac Activity:   Observed
Presentation:       Frank breech

Amniotic Fluid
AFI FV:      Subjectively within normal limits
AFI Sum(cm)     %Tile       Largest Pocket(cm)
14.25           53

RUQ(cm)       RLQ(cm)       LUQ(cm)        LLQ(cm)
4.17
Biophysical Evaluation

Amniotic F.V:   Pocket => 2 cm two         F. Tone:        Observed
planes
F. Movement:    Observed                   N.S.T:          Reactive
F. Breathing:   Observed                   Score:          [DATE]
Gestational Age

LMP:           36w 4d        Date:  02/06/17                 EDD:   11/13/17
Best:          36w 4d     Det. By:  LMP  (02/06/17)          EDD:   11/13/17
Impression

IUP at  56w6d
Normal amniotic fluid volume
BPP[DATE]
Breech presentation
Recommendations

Continue recommended antenatal testing and delivery plan
Follow up presentation next week and manage accordingly

## 2019-08-02 IMAGING — US US FETAL BPP W/ NON-STRESS
1 series · 13 of 13 positions shown · non-contrast
Comparison: none

[Series 1: us fetal bpp w/nonstress · 13 acquisitions, 13 frames shown]
[im 1/13]
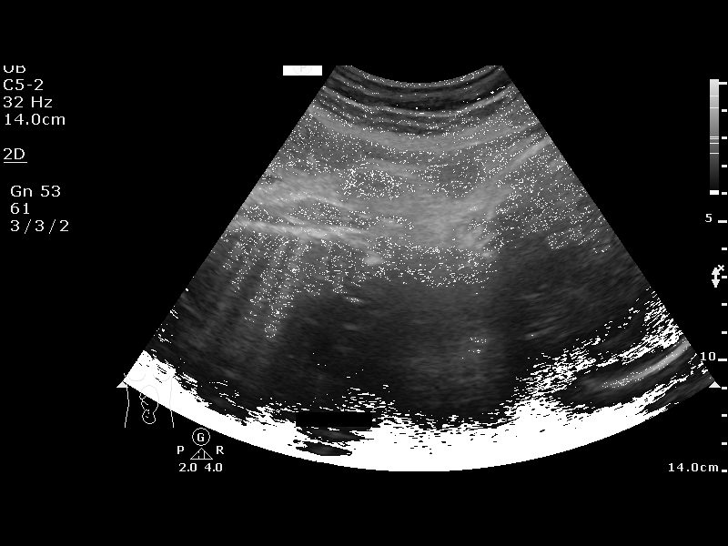
[im 2/13]
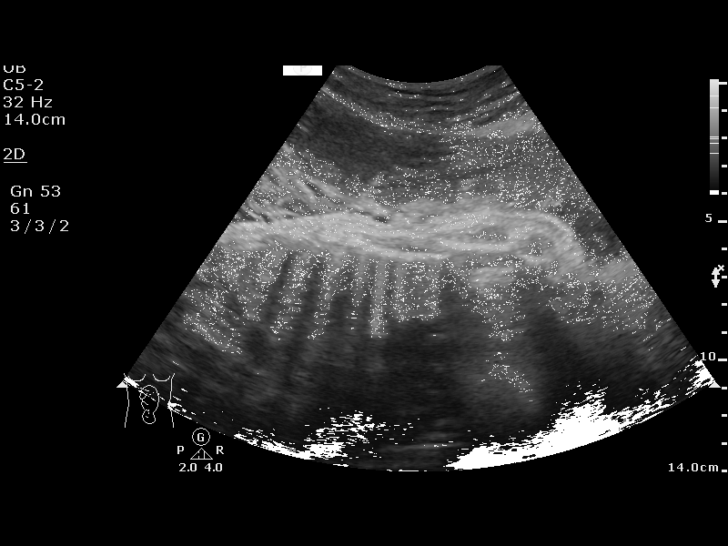
[im 3/13]
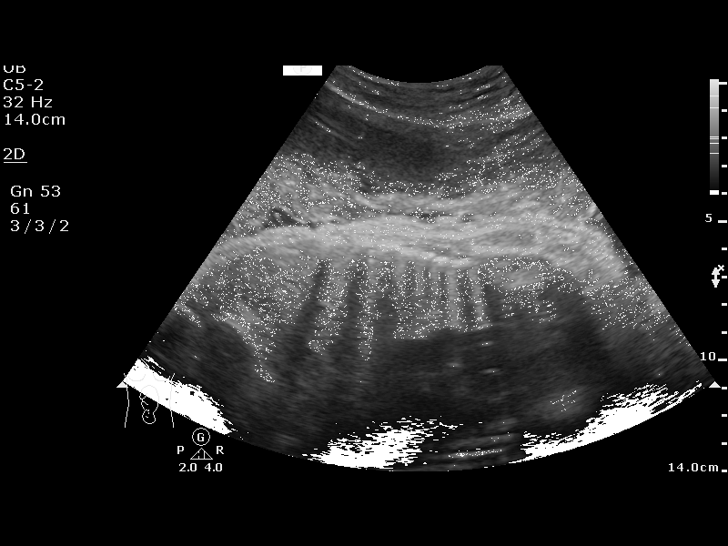
[im 4/13]
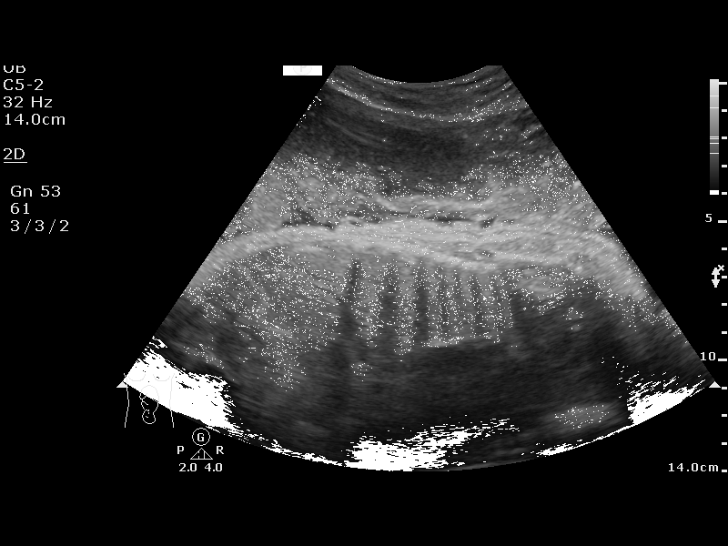
[im 5/13]
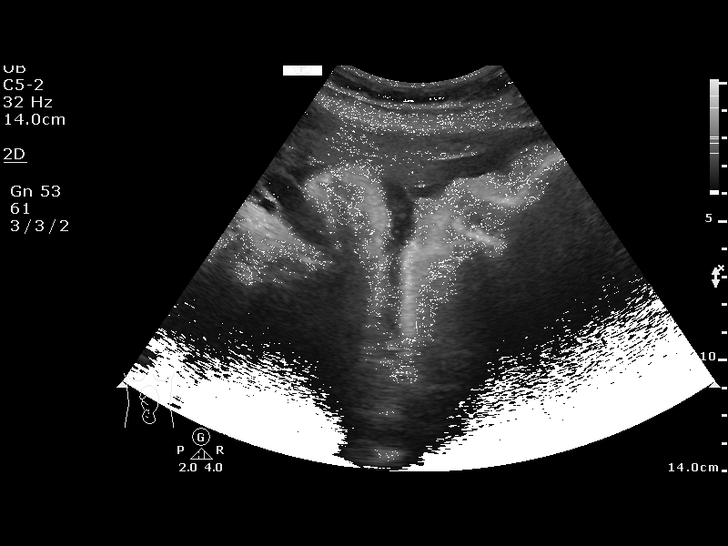
[im 6/13]
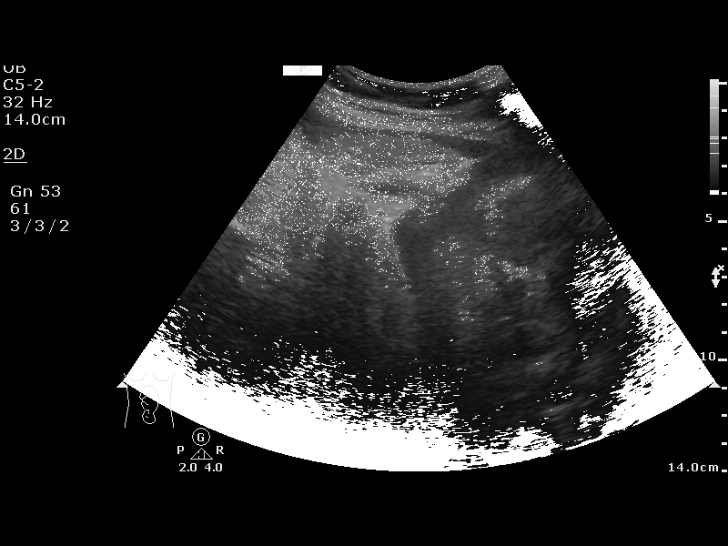
[im 7/13]
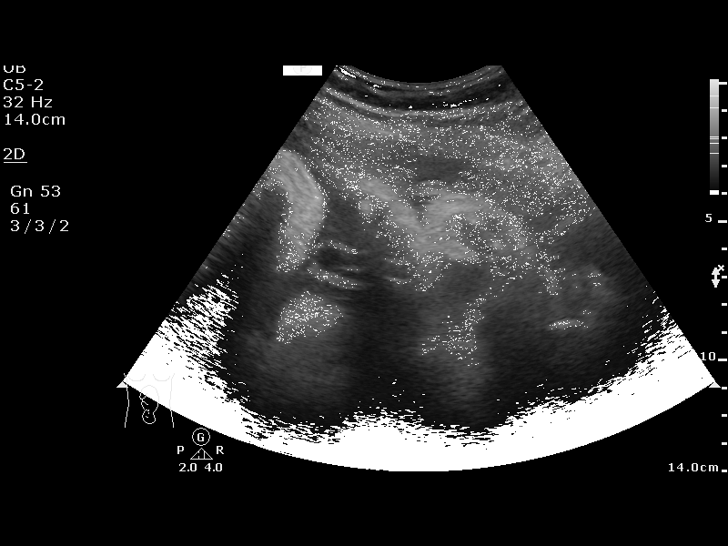
[im 8/13]
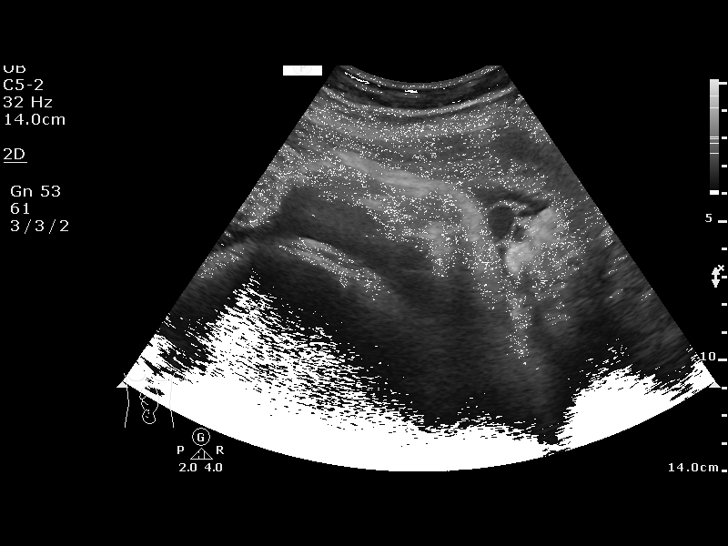
[im 9/13]
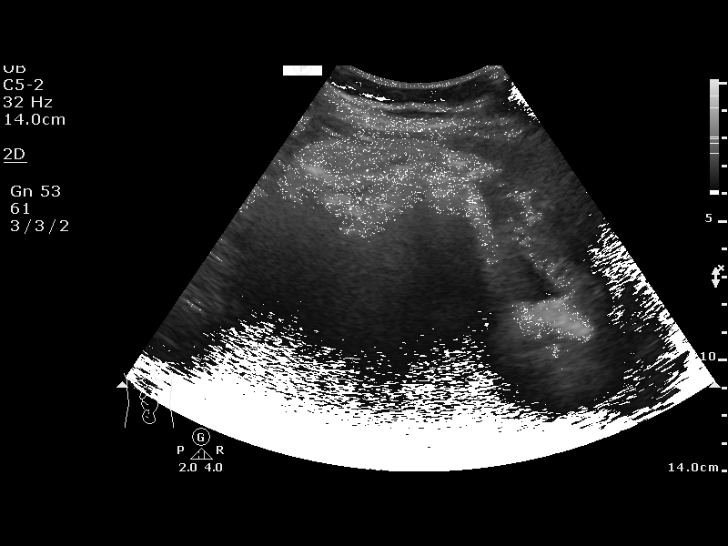
[im 10/13]
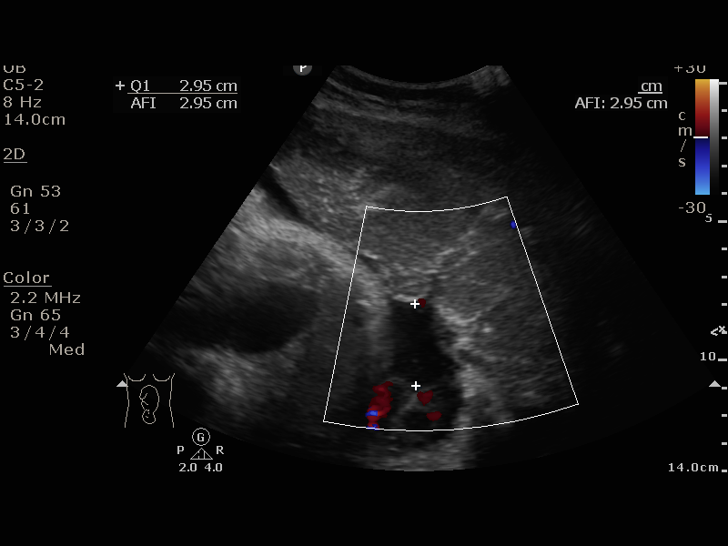
[im 11/13]
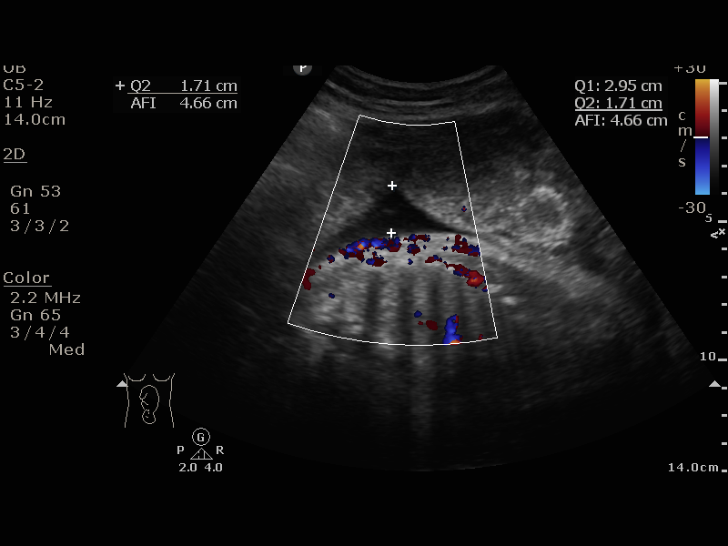
[im 12/13]
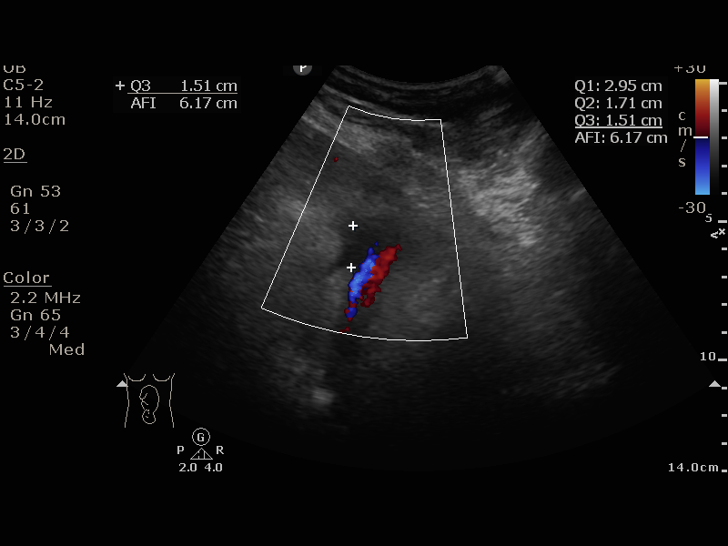
[im 13/13]
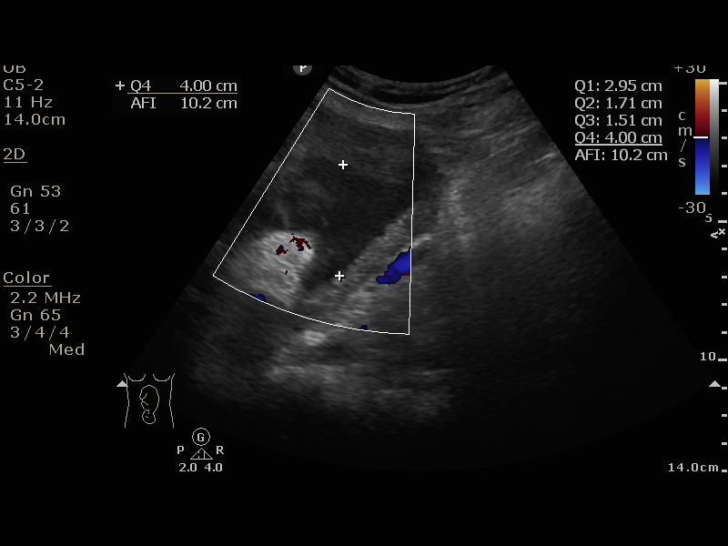

[13 of 13 positions shown; findings below may reference images not displayed]

[facility]
[REDACTED]

1  US FETAL BPP W/NONSTRESS                    76818.4

1  BETHZAIDA BERISHA              [id number]      [id number]     [phone]
Service(s) Provided

Indications

37 weeks gestation of pregnancy
Unspecified pre-existing hypertension
complicating pregnancy, third trimester
Advanced maternal age multigravida 35+,
third trimester
OB History

Blood Type:            Height:  5'4"   Weight (lb):  208       BMI:
Gravidity:    2         Term:   1        Prem:   0        SAB:   0
TOP:          0       Ectopic:  0        Living: 1
Fetal Evaluation

Num Of Fetuses:     1
Preg. Location:     Intrauterine
Cardiac Activity:   Observed
Presentation:       Cephalic

Amniotic Fluid
AFI FV:      Subjectively within normal limits
AFI Sum(cm)     %Tile       Largest Pocket(cm)
10.17           26          4

RUQ(cm)       RLQ(cm)       LUQ(cm)        LLQ(cm)
2.95          4
Biophysical Evaluation

Amniotic F.V:   Pocket => 2 cm two         F. Tone:        Observed
planes
F. Movement:    Observed                   N.S.T:          Reactive
F. Breathing:   Observed                   Score:          [DATE]
Gestational Age

LMP:           37w 4d        Date:  02/06/17                 EDD:   11/13/17
Best:          37w 4d     Det. By:  LMP  (02/06/17)          EDD:   11/13/17
Impression

BPP [DATE]
Recommendations

Continue antepartum testing.

## 2020-04-05 ENCOUNTER — Other Ambulatory Visit: Payer: Self-pay

## 2020-04-05 ENCOUNTER — Inpatient Hospital Stay (HOSPITAL_COMMUNITY)
Admission: AD | Admit: 2020-04-05 | Discharge: 2020-04-05 | Disposition: A | Discharge status: Home / Self Care | Payer: Medicaid Other | Attending: Obstetrics & Gynecology | Admitting: Obstetrics & Gynecology

## 2020-04-05 ENCOUNTER — Encounter (HOSPITAL_COMMUNITY): Payer: Self-pay | Admitting: Obstetrics & Gynecology

## 2020-04-05 ENCOUNTER — Inpatient Hospital Stay (HOSPITAL_COMMUNITY): Payer: Medicaid Other

## 2020-04-05 DIAGNOSIS — O209 Hemorrhage in early pregnancy, unspecified: Secondary | ICD-10-CM | POA: Diagnosis not present

## 2020-04-05 DIAGNOSIS — Z6790 Unspecified blood type, Rh positive: Secondary | ICD-10-CM | POA: Diagnosis not present

## 2020-04-05 DIAGNOSIS — Z3A00 Weeks of gestation of pregnancy not specified: Secondary | ICD-10-CM | POA: Diagnosis not present

## 2020-04-05 DIAGNOSIS — O3680X0 Pregnancy with inconclusive fetal viability, not applicable or unspecified: Secondary | ICD-10-CM

## 2020-04-05 DIAGNOSIS — D259 Leiomyoma of uterus, unspecified: Secondary | ICD-10-CM | POA: Diagnosis not present

## 2020-04-05 DIAGNOSIS — O26851 Spotting complicating pregnancy, first trimester: Secondary | ICD-10-CM

## 2020-04-05 DIAGNOSIS — O3411 Maternal care for benign tumor of corpus uteri, first trimester: Secondary | ICD-10-CM | POA: Diagnosis not present

## 2020-04-05 DIAGNOSIS — N939 Abnormal uterine and vaginal bleeding, unspecified: Secondary | ICD-10-CM

## 2020-04-05 DIAGNOSIS — Z3A01 Less than 8 weeks gestation of pregnancy: Secondary | ICD-10-CM | POA: Diagnosis not present

## 2020-04-05 LAB — WET PREP, GENITAL
Clue Cells Wet Prep HPF POC: NONE SEEN
Sperm: NONE SEEN
Trich, Wet Prep: NONE SEEN
Yeast Wet Prep HPF POC: NONE SEEN

## 2020-04-05 LAB — URINALYSIS, ROUTINE W REFLEX MICROSCOPIC
Bacteria, UA: NONE SEEN
Bilirubin Urine: NEGATIVE
Glucose, UA: NEGATIVE mg/dL
Ketones, ur: NEGATIVE mg/dL
Leukocytes,Ua: NEGATIVE
Nitrite: NEGATIVE
Protein, ur: NEGATIVE mg/dL
Specific Gravity, Urine: 1.013 (ref 1.005–1.030)
pH: 6 (ref 5.0–8.0)

## 2020-04-05 LAB — CBC
HCT: 33.6 % — ABNORMAL LOW (ref 36.0–46.0)
Hemoglobin: 10.6 g/dL — ABNORMAL LOW (ref 12.0–15.0)
MCH: 21.5 pg — ABNORMAL LOW (ref 26.0–34.0)
MCHC: 31.5 g/dL (ref 30.0–36.0)
MCV: 68.3 fL — ABNORMAL LOW (ref 80.0–100.0)
Platelets: 202 10*3/uL (ref 150–400)
RBC: 4.92 MIL/uL (ref 3.87–5.11)
RDW: 15.7 % — ABNORMAL HIGH (ref 11.5–15.5)
WBC: 5 10*3/uL (ref 4.0–10.5)
nRBC: 0 % (ref 0.0–0.2)

## 2020-04-05 LAB — COMPREHENSIVE METABOLIC PANEL
ALT: 20 U/L (ref 0–44)
AST: 18 U/L (ref 15–41)
Albumin: 3.4 g/dL — ABNORMAL LOW (ref 3.5–5.0)
Alkaline Phosphatase: 60 U/L (ref 38–126)
Anion gap: 8 (ref 5–15)
BUN: 17 mg/dL (ref 6–20)
CO2: 23 mmol/L (ref 22–32)
Calcium: 9.3 mg/dL (ref 8.9–10.3)
Chloride: 104 mmol/L (ref 98–111)
Creatinine, Ser: 0.84 mg/dL (ref 0.44–1.00)
GFR, Estimated: >60 mL/min (ref >60)
Glucose, Bld: 97 mg/dL (ref 70–99)
Potassium: 4 mmol/L (ref 3.5–5.1)
Sodium: 135 mmol/L (ref 135–145)
Total Bilirubin: 0.5 mg/dL (ref 0.3–1.2)
Total Protein: 7.1 g/dL (ref 6.5–8.1)

## 2020-04-05 LAB — HCG, QUANTITATIVE, PREGNANCY: hCG, Beta Chain, Quant, S: 207 m[IU]/mL — ABNORMAL HIGH (ref <5)

## 2020-04-05 LAB — POCT PREGNANCY, URINE: Preg Test, Ur: POSITIVE — AB

## 2020-04-05 MED ORDER — AMLODIPINE BESYLATE 5 MG PO TABS: 5.0000 mg | ORAL_TABLET | Freq: Every day | ORAL | 6 refills | Status: DC

## 2020-04-05 NOTE — MAU Provider Note (Signed)
History     CSN: [id number]  Arrival date and time: 04/05/20 1409   First Provider Initiated Contact with Patient 04/05/20 1601      Chief Complaint  Patient presents with  . Vaginal Bleeding   [name] is a 43 y.o. G3P2002 at [redacted]w[redacted]d who presents to MAU for vaginal bleeding which began yesterday. Patient reports it is only pink spotting when wiping after urination. Patient reports she did not come to the hospital for the bleeding, but came to the hospital for pregnancy confirmation.  Passing blood clots? no Blood soaking clothes? no Lightheaded/dizzy? no Significant pelvic pain or cramping? no Passed any tissue? no  Current pregnancy problems? Pt has not yet been seen Blood Type? O Positive Allergies? NKDA  Pt denies vaginal discharge/odor/itching. Pt denies N/V, abdominal pain, constipation, diarrhea, or urinary problems. Pt denies fever, chills, fatigue, sweating or changes in appetite. Pt denies SOB or chest pain. Pt denies dizziness, HA, light-headedness, weakness.   OB History    Gravida  3   Para  2   Term  2   Preterm      AB      Living  2     SAB      TAB      Ectopic      Multiple  0   Live Births  2           Past Medical History:  Diagnosis Date  . Hypertension     Past Surgical History:  Procedure Laterality Date  . CESAREAN SECTION      Family History  Problem Relation Age of Onset  . ADD / ADHD Neg Hx   . Anxiety disorder Neg Hx   . Alcohol abuse Neg Hx   . Arthritis Neg Hx   . Asthma Neg Hx   . Birth defects Neg Hx   . Cancer Neg Hx   . COPD Neg Hx   . Depression Neg Hx   . Diabetes Neg Hx   . Drug abuse Neg Hx   . Early death Neg Hx   . Hearing loss Neg Hx   . Heart disease Neg Hx   . Hyperlipidemia Neg Hx   . Hypertension Neg Hx   . Intellectual disability Neg Hx   . Kidney disease Neg Hx   . Learning disabilities Neg Hx   . Miscarriages / Stillbirths Neg Hx   . Obesity Neg Hx   . Stroke Neg Hx    . Vision loss Neg Hx   . Varicose Veins Neg Hx   . Breast cancer Neg Hx     Social History   Tobacco Use  . Smoking status: Never Smoker  . Smokeless tobacco: Never Used  Substance Use Topics  . Alcohol use: No  . Drug use: No    Allergies: No Known Allergies  Medications Prior to Admission  Medication Sig Dispense Refill Last Dose  . acetaminophen (TYLENOL) 325 MG tablet Take 2 tablets (650 mg total) by mouth every 4 (four) hours as needed (for pain scale < 4). (Patient not taking: Reported on 06/15/2018) 30 tablet 0   . ferrous sulfate 325 (65 FE) MG tablet Take 1 tablet (325 mg total) by mouth 2 (two) times daily with a meal. (Patient not taking: Reported on 06/15/2018) 60 tablet 0   . ibuprofen (ADVIL,MOTRIN) 600 MG tablet Take 1 tablet (600 mg total) by mouth every 6 (six) hours as needed for mild pain or cramping. (Patient  not taking: Reported on 06/15/2018) 30 tablet 0   . phenylephrine-shark liver oil-mineral oil-petrolatum (PREPARATION H) 0.25-14-74.9 % rectal ointment Place 1 application rectally 2 (two) times daily as needed for hemorrhoids. (Patient not taking: Reported on 06/15/2018) 1 Tube 2   . polyethylene glycol (MIRALAX) packet Take 17 g by mouth daily. (Patient not taking: Reported on 06/15/2018) 28 each 1   . Prenatal Vit-Fe Fumarate-FA (PRENATAL MULTIVITAMIN) TABS tablet Take 1 tablet by mouth daily at 12 noon.       Review of Systems  Constitutional: Negative for chills, diaphoresis, fatigue and fever.  Eyes: Negative for visual disturbance.  Respiratory: Negative for shortness of breath.   Cardiovascular: Negative for chest pain.  Gastrointestinal: Negative for abdominal pain, constipation, diarrhea, nausea and vomiting.  Genitourinary: Negative for dysuria, flank pain, frequency, pelvic pain, urgency, vaginal bleeding and vaginal discharge.  Neurological: Negative for dizziness, weakness, light-headedness and headaches.   Physical Exam   Blood pressure (!)  154/87, pulse 77, temperature 99.2 F (37.3 C), resp. rate 18, height 5\' 4"  (1.626 m), weight 92.5 kg, last menstrual period 02/13/2020, currently breastfeeding.  Patient Vitals for the past 24 hrs:  BP Temp Pulse Resp Height Weight  04/05/20 1818 (!) 154/87 -- 77 -- -- --  04/05/20 1433 (!) 160/82 99.2 F (37.3 C) 75 18 5\' 4"  (1.626 m) 92.5 kg   Physical Exam Vitals and nursing note reviewed.  Constitutional:      General: She is not in acute distress.    Appearance: Normal appearance. She is not ill-appearing, toxic-appearing or diaphoretic.  HENT:     Head: Normocephalic and atraumatic.  Pulmonary:     Effort: Pulmonary effort is normal.  Neurological:     Mental Status: She is alert and oriented to person, place, and time.  Psychiatric:        Mood and Affect: Mood normal.        Behavior: Behavior normal.        Thought Content: Thought content normal.        Judgment: Judgment normal.    Results for orders placed or performed during the hospital encounter of 04/05/20 (from the past 24 hour(s))  Urinalysis, Routine w reflex microscopic     Status: Abnormal   Collection Time: 04/05/20  2:24 PM  Result Value Ref Range   Color, Urine STRAW (A) YELLOW   APPearance CLEAR CLEAR   Specific Gravity, Urine 1.013 1.005 - 1.030   pH 6.0 5.0 - 8.0   Glucose, UA NEGATIVE NEGATIVE mg/dL   Hgb urine dipstick MODERATE (A) NEGATIVE   Bilirubin Urine NEGATIVE NEGATIVE   Ketones, ur NEGATIVE NEGATIVE mg/dL   Protein, ur NEGATIVE NEGATIVE mg/dL   Nitrite NEGATIVE NEGATIVE   Leukocytes,Ua NEGATIVE NEGATIVE   RBC / HPF 0-5 0 - 5 RBC/hpf   WBC, UA 0-5 0 - 5 WBC/hpf   Bacteria, UA NONE SEEN NONE SEEN   Squamous Epithelial / LPF 0-5 0 - 5   Mucus PRESENT   Pregnancy, urine POC     Status: Abnormal   Collection Time: 04/05/20  2:42 PM  Result Value Ref Range   Preg Test, Ur POSITIVE (A) NEGATIVE  CBC     Status: Abnormal   Collection Time: 04/05/20  4:34 PM  Result Value Ref Range    WBC 5.0 4.0 - 10.5 K/uL   RBC 4.92 3.87 - 5.11 MIL/uL   Hemoglobin 10.6 (L) 12.0 - 15.0 g/dL   HCT 33.6 (L) 36 -  46 %   MCV 68.3 (L) 80.0 - 100.0 fL   MCH 21.5 (L) 26.0 - 34.0 pg   MCHC 31.5 30.0 - 36.0 g/dL   RDW 15.7 (H) 11.5 - 15.5 %   Platelets 202 150 - 400 K/uL   nRBC 0.0 0.0 - 0.2 %  Comprehensive metabolic panel     Status: Abnormal   Collection Time: 04/05/20  4:34 PM  Result Value Ref Range   Sodium 135 135 - 145 mmol/L   Potassium 4.0 3.5 - 5.1 mmol/L   Chloride 104 98 - 111 mmol/L   CO2 23 22 - 32 mmol/L   Glucose, Bld 97 70 - 99 mg/dL   BUN 17 6 - 20 mg/dL   Creatinine, Ser 0.84 0.44 - 1.00 mg/dL   Calcium 9.3 8.9 - 10.3 mg/dL   Total Protein 7.1 6.5 - 8.1 g/dL   Albumin 3.4 (L) 3.5 - 5.0 g/dL   AST 18 15 - 41 U/L   ALT 20 0 - 44 U/L   Alkaline Phosphatase 60 38 - 126 U/L   Total Bilirubin 0.5 0.3 - 1.2 mg/dL   GFR, Estimated >60 >60 mL/min   Anion gap 8 5 - 15  hCG, quantitative, pregnancy     Status: Abnormal   Collection Time: 04/05/20  4:34 PM  Result Value Ref Range   hCG, Beta Chain, Quant, S 207 (H) <5 mIU/mL  Wet prep, genital     Status: Abnormal   Collection Time: 04/05/20  5:30 PM  Result Value Ref Range   Yeast Wet Prep HPF POC NONE SEEN NONE SEEN   Trich, Wet Prep NONE SEEN NONE SEEN   Clue Cells Wet Prep HPF POC NONE SEEN NONE SEEN   WBC, Wet Prep HPF POC FEW (A) NONE SEEN   Sperm NONE SEEN    US OB LESS THAN 14 WEEKS WITH OB TRANSVAGINAL  Result Date: 04/05/2020 CLINICAL DATA:  Spotting EXAM: OBSTETRIC <14 WK [place] AND TRANSVAGINAL OB US TECHNIQUE: Both transabdominal and transvaginal ultrasound examinations were performed for complete evaluation of the gestation as well as the maternal uterus, adnexal regions, and pelvic cul-de-sac. Transvaginal technique was performed to assess early pregnancy. COMPARISON:  None. FINDINGS: Intrauterine gestational sac: None Yolk sac:  Not Visualized. Embryo:  Not Visualized. Maternal uterus/adnexae: Multiple  hypoechoic uterine masses, some with peripheral calcification. Right fundal mass measures 2.6 x 2.3 x 2.7 cm. Left posterior fundal mass measures 2.4 x 2.2 x 2.3 cm. Mid uterine mass measures 1.6 x 1.3 x 2 cm. Ovaries are within normal limits. Left ovary measures 1.4 x 2.8 x 1.3 cm. Right ovary measures 1.6 x 2.2 x 1 cm. No significant free fluid. IMPRESSION: 1. No IUP identified. Findings consistent with pregnancy of unknown location, differential of which includes IUP too early to visualize, recent failed pregnancy, and occult ectopic pregnancy. Recommend trending of HCG with repeat ultrasound as indicated 2. Multiple uterine fibroids. Electronically Signed   By: Donavan Foil M.D.   On: 04/05/2020 17:09    MAU Course  Procedures  MDM -r/o ectopic -UA: straw/mod hgb -CBC: Hgb 10.6 -CMP: WNL -[place]: PUL, multiple fibroids -hCG: 207 -ABO: O Positive -WetPrep: WNL -GC/CT collected -Discussed with client the diagnosis of pregnancy of unknown anatomic location.  Three possibilities of outcome are: a healthy pregnancy that is too early to see a yolk sac to confirm the pregnancy is in the uterus, a pregnancy that is not healthy and has not developed and will not  develop, and an ectopic pregnancy that is in the abdomen that cannot be identified at this time.  And ectopic pregnancy can be a life threatening situation as a pregnancy needs to be in the uterus which is a muscle and can stretch to accommodate the growth of a pregnancy.  Other structures in the pelvis and abdomen as not muscular and do not stretch with the growth of a pregnancy.  Worst case scenario is that a structure ruptures with a growing pregnancy not in the uterus and and internal hemorrhage can be a life threatening situation.  We need to follow the progression of this pregnancy carefully.  We need to check another serum pregnancy hormone level to determine if the levels are rising appropriately  and to determine the next steps that are  needed for you. Patient's questions were answered. -consulted with Dr. Harolyn Rutherford regarding BP, advised to start on Norvasc 5mg  daily -pt discharged to home in stable condition  Orders Placed This Encounter  Procedures  . Wet prep, genital    Standing Status:   Standing    Number of Occurrences:   1  . US OB LESS THAN 14 WEEKS WITH OB TRANSVAGINAL    Standing Status:   Standing    Number of Occurrences:   1    Order Specific Question:   Symptom/Reason for Exam    Answer:   Vaginal spotting [[id number]]  . Urinalysis, Routine w reflex microscopic Urine, Clean Catch    Standing Status:   Standing    Number of Occurrences:   1  . CBC    Standing Status:   Standing    Number of Occurrences:   1  . Comprehensive metabolic panel    Standing Status:   Standing    Number of Occurrences:   1  . hCG, quantitative, pregnancy    Standing Status:   Standing    Number of Occurrences:   1  . Pregnancy, urine POC    Standing Status:   Standing    Number of Occurrences:   1  . Discharge patient    Order Specific Question:   Discharge disposition    Answer:   01-Home or Self Care [1]    Order Specific Question:   Discharge patient date    Answer:   04/05/2020   Meds ordered this encounter  Medications  . amLODipine (NORVASC) 5 MG tablet    Sig: Take 1 tablet (5 mg total) by mouth daily.    Dispense:  30 tablet    Refill:  6    Order Specific Question:   Supervising Provider    Answer:   Woodroe Mode [[id number]]    Assessment and Plan   1. Pregnancy of unknown anatomic location   2. Vaginal spotting   3. Blood type, Rh positive     Allergies as of 04/05/2020   No Known Allergies     Medication List    STOP taking these medications   ibuprofen 600 MG tablet Commonly known as: ADVIL     TAKE these medications   acetaminophen 325 MG tablet Commonly known as: Tylenol Take 2 tablets (650 mg total) by mouth every 4 (four) hours as needed (for pain scale < 4).   amLODipine 5 MG  tablet Commonly known as: NORVASC Take 1 tablet (5 mg total) by mouth daily.   ferrous sulfate 325 (65 FE) MG tablet Take 1 tablet (325 mg total) by mouth 2 (two) times daily with a meal.  phenylephrine-shark liver oil-mineral oil-petrolatum 0.25-14-74.9 % rectal ointment Commonly known as: Preparation H Place 1 application rectally 2 (two) times daily as needed for hemorrhoids.   polyethylene glycol 17 g packet Commonly known as: MiraLax Take 17 g by mouth daily.   prenatal multivitamin Tabs tablet Take 1 tablet by mouth daily at 12 noon.      -will call with culture results, if positive -safe meds in pregnancy list given -RX Norvasc, will recheck BP in MAU on Saturday, pt unsure if she will start medication -discussed ectopic vs. SAB vs. miscarriage -strict ectopic precautions given -return MAU precautions -f/u on 04/08/2020 at MAU in AM for repeat hCG -pt discharged to home in stable condition  Elmyra Ricks E Tehila Sokolow 04/05/2020, 6:39 PM

## 2020-04-05 NOTE — MAU Note (Signed)
Pt stated she missed her period and took HPT 2 days ago and it was positive. Had some spotting yesterday nothing today. Wants to make sure everything is alright. No c/o pain or cramping a this time.

## 2020-04-05 NOTE — Discharge Instructions (Signed)
Ectopic Pregnancy ° °An ectopic pregnancy is when the fertilized egg attaches (implants) outside the uterus. Most ectopic pregnancies occur in one of the tubes where eggs travel from the ovary to the uterus (fallopian tubes), but the implanting can occur in other locations. In rare cases, ectopic pregnancies occur on the ovary, intestine, pelvis, abdomen, or cervix. In an ectopic pregnancy, the fertilized egg does not have the ability to develop into a normal, healthy baby. °A ruptured ectopic pregnancy is one in which tearing or bursting of a fallopian tube causes internal bleeding. Often, there is intense lower abdominal pain, and vaginal bleeding sometimes occurs. Having an ectopic pregnancy can be life-threatening. If this dangerous condition is not treated, it can lead to blood loss, shock, or even death. °What are the causes? °The most common cause of this condition is damage to one of the fallopian tubes. A fallopian tube may be narrowed or blocked, and that keeps the fertilized egg from reaching the uterus. °What increases the risk? °This condition is more likely to develop in women of childbearing age who have different levels of risk. The levels of risk can be divided into three categories. °High risk °· You have gone through infertility treatment. °· You have had an ectopic pregnancy before. °· You have had surgery on the fallopian tubes, or another surgical procedure, such as an abortion. °· You have had surgery to have the fallopian tubes tied (tubal ligation). °· You have problems or diseases of the fallopian tubes. °· You have been exposed to diethylstilbestrol (DES). This medicine was used until 1971, and it had effects on babies whose mothers took the medicine. °· You become pregnant while using an IUD (intrauterine device) for birth control. °Moderate risk °· You have a history of infertility. °· You have had an STI (sexually transmitted infection). °· You have a history of pelvic inflammatory  disease (PID). °· You have scarring from endometriosis. °· You have multiple sexual partners. °· You smoke. °Low risk °· You have had pelvic surgery. °· You use vaginal douches. °· You became sexually active before age 18. °What are the signs or symptoms? °Common symptoms of this condition include normal pregnancy symptoms, such as missing a period, nausea, tiredness, abdominal pain, breast tenderness, and bleeding. However, ectopic pregnancy will have additional symptoms, such as: °· Pain with intercourse. °· Irregular vaginal bleeding or spotting. °· Cramping or pain on one side or in the lower abdomen. °· Fast heartbeat, low blood pressure, and sweating. °· Passing out while having a bowel movement. °Symptoms of a ruptured ectopic pregnancy and internal bleeding may include: °· Sudden, severe pain in the abdomen and pelvis. °· Dizziness, weakness, light-headedness, or fainting. °· Pain in the shoulder or neck area. °How is this diagnosed? °This condition is diagnosed by: °· A pelvic exam to locate pain or a mass in the abdomen. °· A pregnancy test. This blood test checks for the presence as well as the specific level of pregnancy hormone in the bloodstream. °· Ultrasound. This is performed if a pregnancy test is positive. In this test, a probe is inserted into the vagina. The probe will detect a fetus, possibly in a location other than the uterus. °· Taking a sample of uterus tissue (dilation and curettage, or D&C). °· Surgery to perform a visual exam of the inside of the abdomen using a thin, lighted tube that has a tiny camera on the end (laparoscope). °· Culdocentesis. This procedure involves inserting a needle at the top of   the vagina, behind the uterus. If blood is present in this area, it may indicate that a fallopian tube is torn. How is this treated? This condition is treated with medicine or surgery. Medicine  An injection of a medicine (methotrexate) may be given to cause the pregnancy tissue to be  absorbed. This medicine may save your fallopian tube. It may be given if: ? The diagnosis is made early, with no signs of active bleeding. ? The fallopian tube has not ruptured. ? You are considered to be a good candidate for the medicine. Usually, pregnancy hormone blood levels are checked after methotrexate treatment. This is to be sure that the medicine is effective. It may take 4-6 weeks for the pregnancy to be absorbed. Most pregnancies will be absorbed by 3 weeks. Surgery  A laparoscope may be used to remove the pregnancy tissue.  If severe internal bleeding occurs, a larger cut (incision) may be made in the lower abdomen (laparotomy) to remove the fetus and placenta. This is done to stop the bleeding.  Part or all of the fallopian tube may be removed (salpingectomy) along with the fetus and placenta. The fallopian tube may also be repaired during the surgery.  In very rare circumstances, removal of the uterus (hysterectomy) may be required.  After surgery, pregnancy hormone testing may be done to be sure that there is no pregnancy tissue left. Whether your treatment is medicine or surgery, you may receive a Rho (D) immune globulin shot to prevent problems with any future pregnancy. This shot may be given if:  You are Rh-negative and the baby's father is Rh-positive.  You are Rh-negative and you do not know the Rh type of the baby's father. Follow these instructions at home:  Rest and limit your activity after the procedure for as long as told by your health care provider.  Until your health care provider says that it is safe: ? Do not lift anything that is heavier than 10 lb (4.5 kg), or the limit that your health care provider tells you. ? Avoid physical exercise and any movement that requires effort (is strenuous).  To help prevent constipation: ? Eat a healthy diet that includes fruits, vegetables, and whole grains. ? Drink 6-8 glasses of water per day. Get help right away  if:  You develop worsening pain that is not relieved by medicine.  You have: ? A fever or chills. ? Vaginal bleeding. ? Redness and swelling at the incision site. ? Nausea and vomiting.  You feel dizzy or weak.  You feel light-headed or you faint. This information is not intended to replace advice given to you by your health care provider. Make sure you discuss any questions you have with your health care provider. Document Revised: 05/09/2017 Document Reviewed: 12/27/2015 Elsevier Patient Education  [place] A miscarriage is the loss of an unborn baby (fetus) before the 20th week of pregnancy. Most miscarriages happen during the first 3 months of pregnancy. Sometimes, a miscarriage can happen before a woman knows that she is pregnant. Having a miscarriage can be an emotional experience. If you have had a miscarriage, talk with your health care provider about any questions you may have about miscarrying, the grieving process, and your plans for future pregnancy. What are the causes? A miscarriage may be caused by:  Problems with the genes or chromosomes of the fetus. These problems make it impossible for the baby to develop normally. They are  often the result of random errors that occur early in the development of the baby, and are not passed from parent to child (not inherited).  Infection of the cervix or uterus.  Conditions that affect hormone balance in the body.  Problems with the cervix, such as the cervix opening and thinning before pregnancy is at term (cervical insufficiency).  Problems with the uterus. These may include: ? A uterus with an abnormal shape. ? Fibroids in the uterus. ? Congenital abnormalities. These are problems that were present at birth.  Certain medical conditions.  Smoking, drinking alcohol, or using drugs.  Injury (trauma). In many cases, the cause of a miscarriage is not known. What are the signs or  symptoms? Symptoms of this condition include:  Vaginal bleeding or spotting, with or without cramps or pain.  Pain or cramping in the abdomen or lower back.  Passing fluid, tissue, or blood clots from the vagina. How is this diagnosed? This condition may be diagnosed based on:  A physical exam.  Ultrasound.  Blood tests.  Urine tests. How is this treated? Treatment for a miscarriage is sometimes not necessary if you naturally pass all the tissue that was in your uterus. If necessary, this condition may be treated with:  Dilation and curettage (D&C). This is a procedure in which the cervix is stretched open and the lining of the uterus (endometrium) is scraped. This is done only if tissue from the fetus or placenta remains in the body (incomplete miscarriage).  Medicines, such as: ? Antibiotic medicine, to treat infection. ? Medicine to help the body pass any remaining tissue. ? Medicine to reduce (contract) the size of the uterus. These medicines may be given if you have a lot of bleeding. If you have Rh negative blood and your baby was Rh positive, you will need a shot of a medicine called Rh immunoglobulinto protect your future babies from Rh blood problems. "Rh-negative" and "Rh-positive" refer to whether or not the blood has a specific protein found on the surface of red blood cells (Rh factor). Follow these instructions at home: Medicines   Take over-the-counter and prescription medicines only as told by your health care provider.  If you were prescribed antibiotic medicine, take it as told by your health care provider. Do not stop taking the antibiotic even if you start to feel better.  Do not take NSAIDs, such as aspirin and ibuprofen, unless they are approved by your health care provider. These medicines can cause bleeding. Activity  Rest as directed. Ask your health care provider what activities are safe for you.  Have someone help with home and family  responsibilities during this time. General instructions  Keep track of the number of sanitary pads you use each day and how soaked (saturated) they are. Write down this information.  Monitor the amount of tissue or blood clots that you pass from your vagina. Save any large amounts of tissue for your health care provider to examine.  Do not use tampons, douche, or have sex until your health care provider approves.  To help you and your partner with the process of grieving, talk with your health care provider or seek counseling.  When you are ready, meet with your health care provider to discuss any important steps you should take for your health. Also, discuss steps you should take to have a healthy pregnancy in the future.  Keep all follow-up visits as told by your health care provider. This is important. Where to find more  information  The American Congress of Obstetricians and Gynecologists: www.acog.org  [place] Department of Health and [place], systems of Womens Health: [place] Contact a health care provider if:  You have a fever or chills.  You have a foul smelling vaginal discharge.  You have more bleeding instead of less. Get help right away if:  You have severe cramps or pain in your back or abdomen.  You pass blood clots or tissue from your vagina that is walnut-sized or larger.  You soak more than 1 regular sanitary pad in an hour.  You become light-headed or weak.  You pass out.  You have feelings of sadness that take over your thoughts, or you have thoughts of hurting yourself. Summary  Most miscarriages happen in the first 3 months of pregnancy. Sometimes miscarriage happens before a woman even knows that she is pregnant.  Follow your health care provider's instruction for home care. Keep all follow-up appointments.  To help you and your partner with the process of grieving, talk with your health care provider or seek counseling. This  information is not intended to replace advice given to you by your health care provider. Make sure you discuss any questions you have with your health care provider. Document Revised: 09/18/2018 Document Reviewed: 07/02/2016 Elsevier Patient Education  [place] of Pregnancy The first trimester of pregnancy is from week 1 until the end of week 13 (months 1 through 3). A week after a sperm fertilizes an egg, the egg will implant on the wall of the uterus. This embryo will begin to develop into a baby. Genes from you and your partner will form the baby. The female genes will determine whether the baby will be a boy or a girl. At 6-8 weeks, the eyes and face will be formed, and the heartbeat can be seen on ultrasound. At the end of 12 weeks, all the baby's organs will be formed. Now that you are pregnant, you will want to do everything you can to have a healthy baby. Two of the most important things are to get good prenatal care and to follow your health care provider's instructions. Prenatal care is all the medical care you receive before the baby's birth. This care will help prevent, find, and treat any problems during the pregnancy and childbirth. Body changes during your first trimester Your body goes through many changes during pregnancy. The changes vary from woman to woman.  You may gain or lose a couple of pounds at first.  You may feel sick to your stomach (nauseous) and you may throw up (vomit). If the vomiting is uncontrollable, call your health care provider.  You may tire easily.  You may develop headaches that can be relieved by medicines. All medicines should be approved by your health care provider.  You may urinate more often. Painful urination may mean you have a bladder infection.  You may develop heartburn as a result of your pregnancy.  You may develop constipation because certain hormones are causing the muscles that push stool through  your intestines to slow down.  You may develop hemorrhoids or swollen veins (varicose veins).  Your breasts may begin to grow larger and become tender. Your nipples may stick out more, and the tissue that surrounds them (areola) may become darker.  Your gums may bleed and may be sensitive to brushing and flossing.  Dark spots or blotches (chloasma, mask of pregnancy) may develop on your  face. This will likely fade after the baby is born.  Your menstrual periods will stop.  You may have a loss of appetite.  You may develop cravings for certain kinds of food.  You may have changes in your emotions from day to day, such as being excited to be pregnant or being concerned that something may go wrong with the pregnancy and baby.  You may have more vivid and strange dreams.  You may have changes in your hair. These can include thickening of your hair, rapid growth, and changes in texture. Some women also have hair loss during or after pregnancy, or hair that feels dry or thin. Your hair will most likely return to normal after your baby is born. What to expect at prenatal visits During a routine prenatal visit:  You will be weighed to make sure you and the baby are growing normally.  Your blood pressure will be taken.  Your abdomen will be measured to track your baby's growth.  The fetal heartbeat will be listened to between weeks 10 and 14 of your pregnancy.  Test results from any previous visits will be discussed. Your health care provider may ask you:  How you are feeling.  If you are feeling the baby move.  If you have had any abnormal symptoms, such as leaking fluid, bleeding, severe headaches, or abdominal cramping.  If you are using any tobacco products, including cigarettes, chewing tobacco, and electronic cigarettes.  If you have any questions. Other tests that may be performed during your first trimester include:  Blood tests to find your blood type and to check for  the presence of any previous infections. The tests will also be used to check for low iron levels (anemia) and protein on red blood cells (Rh antibodies). Depending on your risk factors, or if you previously had diabetes during pregnancy, you may have tests to check for high blood sugar that affects pregnant women (gestational diabetes).  Urine tests to check for infections, diabetes, or protein in the urine.  An ultrasound to confirm the proper growth and development of the baby.  Fetal screens for spinal cord problems (spina bifida) and Down syndrome.  HIV (human immunodeficiency virus) testing. Routine prenatal testing includes screening for HIV, unless you choose not to have this test.  You may need other tests to make sure you and the baby are doing well. Follow these instructions at home: Medicines  Follow your health care provider's instructions regarding medicine use. Specific medicines may be either safe or unsafe to take during pregnancy.  Take a prenatal vitamin that contains at least 600 micrograms (mcg) of folic acid.  If you develop constipation, try taking a stool softener if your health care provider approves. Eating and drinking   Eat a balanced diet that includes fresh fruits and vegetables, whole grains, good sources of protein such as meat, eggs, or tofu, and low-fat dairy. Your health care provider will help you determine the amount of weight gain that is right for you.  Avoid raw meat and uncooked cheese. These carry germs that can cause birth defects in the baby.  Eating four or five small meals rather than three large meals a day may help relieve nausea and vomiting. If you start to feel nauseous, eating a few soda crackers can be helpful. Drinking liquids between meals, instead of during meals, also seems to help ease nausea and vomiting.  Limit foods that are high in fat and processed sugars, such as fried and  sweet foods.  To prevent constipation: ? Eat foods  that are high in fiber, such as fresh fruits and vegetables, whole grains, and beans. ? Drink enough fluid to keep your urine clear or pale yellow. Activity  Exercise only as directed by your health care provider. Most women can continue their usual exercise routine during pregnancy. Try to exercise for 30 minutes at least 5 days a week. Exercising will help you: ? Control your weight. ? Stay in shape. ? Be prepared for labor and delivery.  Experiencing pain or cramping in the lower abdomen or lower back is a good sign that you should stop exercising. Check with your health care provider before continuing with normal exercises.  Try to avoid standing for long periods of time. Move your legs often if you must stand in one place for a long time.  Avoid heavy lifting.  Wear low-heeled shoes and practice good posture.  You may continue to have sex unless your health care provider tells you not to. Relieving pain and discomfort  Wear a good support bra to relieve breast tenderness.  Take warm sitz baths to soothe any pain or discomfort caused by hemorrhoids. Use hemorrhoid cream if your health care provider approves.  Rest with your legs elevated if you have leg cramps or low back pain.  If you develop varicose veins in your legs, wear support hose. Elevate your feet for 15 minutes, 3-4 times a day. Limit salt in your diet. Prenatal care  Schedule your prenatal visits by the twelfth week of pregnancy. They are usually scheduled monthly at first, then more often in the last 2 months before delivery.  Write down your questions. Take them to your prenatal visits.  Keep all your prenatal visits as told by your health care provider. This is important. Safety  Wear your seat belt at all times when driving.  Make a list of emergency phone numbers, including numbers for family, friends, the hospital, and police and fire departments. General instructions  Ask your health care provider for  a referral to a local prenatal education class. Begin classes no later than the beginning of month 6 of your pregnancy.  Ask for help if you have counseling or nutritional needs during pregnancy. Your health care provider can offer advice or refer you to specialists for help with various needs.  Do not use hot tubs, steam rooms, or saunas.  Do not douche or use tampons or scented sanitary pads.  Do not cross your legs for long periods of time.  Avoid cat litter boxes and soil used by cats. These carry germs that can cause birth defects in the baby and possibly loss of the fetus by miscarriage or stillbirth.  Avoid all smoking, herbs, alcohol, and medicines not prescribed by your health care provider. Chemicals in these products affect the formation and growth of the baby.  Do not use any products that contain nicotine or tobacco, such as cigarettes and e-cigarettes. If you need help quitting, ask your health care provider. You may receive counseling support and other resources to help you quit.  Schedule a dentist appointment. At home, brush your teeth with a soft toothbrush and be gentle when you floss. Contact a health care provider if:  You have dizziness.  You have mild pelvic cramps, pelvic pressure, or nagging pain in the abdominal area.  You have persistent nausea, vomiting, or diarrhea.  You have a bad smelling vaginal discharge.  You have pain when you urinate.  You  notice increased swelling in your face, hands, legs, or ankles.  You are exposed to fifth disease or chickenpox.  You are exposed to [place] measles (rubella) and have never had it. Get help right away if:  You have a fever.  You are leaking fluid from your vagina.  You have spotting or bleeding from your vagina.  You have severe abdominal cramping or pain.  You have rapid weight gain or loss.  You vomit blood or material that looks like coffee grounds.  You develop a severe headache.  You have  shortness of breath.  You have any kind of trauma, such as from a fall or a car accident. Summary  The first trimester of pregnancy is from week 1 until the end of week 13 (months 1 through 3).  Your body goes through many changes during pregnancy. The changes vary from woman to woman.  You will have routine prenatal visits. During those visits, your health care provider will examine you, discuss any test results you may have, and talk with you about how you are feeling. This information is not intended to replace advice given to you by your health care provider. Make sure you discuss any questions you have with your health care provider. Document Revised: 05/09/2017 Document Reviewed: 05/08/2016 Elsevier Patient Education  [place].                        Safe Medications in Pregnancy    Acne: Benzoyl Peroxide Salicylic Acid  Backache/Headache: Tylenol: 2 regular strength every 4 hours OR              2 Extra strength every 6 hours  Colds/Coughs/Allergies: Benadryl (alcohol free) 25 mg every 6 hours as needed Breath right strips Claritin Cepacol throat lozenges Chloraseptic throat spray Cold-Eeze- up to three times per day Cough drops, alcohol free Flonase (by prescription only) Guaifenesin Mucinex Robitussin DM (plain only, alcohol free) Saline nasal spray/drops Sudafed (pseudoephedrine) & Actifed ** use only after [redacted] weeks gestation and if you do not have high blood pressure Tylenol Vicks Vaporub Zinc lozenges Zyrtec   Constipation: Colace Ducolax suppositories Fleet enema Glycerin suppositories Metamucil Milk of magnesia Miralax Senokot Smooth move tea  Diarrhea: Kaopectate Imodium A-D  *NO pepto Bismol  Hemorrhoids: Anusol Anusol HC Preparation H Tucks  Indigestion: Tums Maalox Mylanta Zantac  Pepcid  Insomnia: Benadryl (alcohol free) 25mg  every 6 hours as needed Tylenol PM Unisom, no Gelcaps  Leg  Cramps: Tums MagGel  Nausea/Vomiting:  Bonine Dramamine Emetrol Ginger extract Sea bands Meclizine  Nausea medication to take during pregnancy:  Unisom (doxylamine succinate 25 mg tablets) Take one tablet daily at bedtime. If symptoms are not adequately controlled, the dose can be increased to a maximum recommended dose of two tablets daily (1/2 tablet in the morning, 1/2 tablet mid-afternoon and one at bedtime). Vitamin B6 100mg  tablets. Take one tablet twice a day (up to 200 mg per day).  Skin Rashes: Aveeno products Benadryl cream or 25mg  every 6 hours as needed Calamine Lotion 1% cortisone cream  Yeast infection: Gyne-lotrimin 7 Monistat 7   **If taking multiple medications, please check labels to avoid duplicating the same active ingredients **take medication as directed on the label ** Do not exceed 4000 mg of tylenol in 24 hours **Do not take medications that contain aspirin or ibuprofen

## 2020-04-06 LAB — GC/CHLAMYDIA PROBE AMP (~~LOC~~) NOT AT ARMC
Chlamydia: NEGATIVE
Comment: NEGATIVE
Comment: NORMAL
Neisseria Gonorrhea: NEGATIVE

## 2020-04-08 ENCOUNTER — Inpatient Hospital Stay (HOSPITAL_COMMUNITY)
Admission: AD | Admit: 2020-04-08 | Discharge: 2020-04-08 | Disposition: A | Discharge status: Home / Self Care | Payer: Medicaid Other | Attending: Obstetrics & Gynecology | Admitting: Obstetrics & Gynecology

## 2020-04-08 ENCOUNTER — Other Ambulatory Visit: Payer: Self-pay

## 2020-04-08 DIAGNOSIS — Z3A01 Less than 8 weeks gestation of pregnancy: Secondary | ICD-10-CM | POA: Diagnosis not present

## 2020-04-08 DIAGNOSIS — O039 Complete or unspecified spontaneous abortion without complication: Secondary | ICD-10-CM | POA: Diagnosis not present

## 2020-04-08 LAB — HCG, QUANTITATIVE, PREGNANCY: hCG, Beta Chain, Quant, S: 123 m[IU]/mL — ABNORMAL HIGH (ref <5)

## 2020-04-08 NOTE — MAU Provider Note (Signed)
History   Chief Complaint:  Follow-up   Sharon Harvey is  44 y.o. [id number] Patient's last menstrual period was 02/13/2020.[name] Patient is here for follow up of quantitative HCG and ongoing surveillance of pregnancy status. She is [redacted]w[redacted]d weeks gestation  by LMP.    Since her last visit, the patient is without new complaint. The patient reports bleeding as  lighter than period.  She denies any pain.  General ROS:  positive for vaginal bleeding  Her previous Quantitative HCG values are:   Results for DERISHA, Harvey (MRN [id number]) as of 04/08/2020 12:49  Ref. Range 04/05/2020 16:34  HCG, Beta Chain, Quant, S Latest Ref Range: <5 mIU/mL 207 (H)    Physical Exam   Blood pressure (!) 147/80, pulse 95, temperature 98.6 F (37 C), temperature source Oral, resp. rate 16, last menstrual period 02/13/2020, SpO2 100 %, currently breastfeeding.  Focused Gynecological Exam: exam declined by the patient  Labs: Results for orders placed or performed during the hospital encounter of 04/08/20 (from the past 24 hour(s))  hCG, quantitative, pregnancy   Collection Time: 04/08/20 11:26 AM  Result Value Ref Range   hCG, Beta Chain, Quant, S 123 (H) <5 mIU/mL    Assessment:   1. SAB (spontaneous abortion)   2. [redacted] weeks gestation of pregnancy       Plan: -Discharge home in stable condition -Vaginal bleeding and pain precautions discussed -Patient advised to follow-up with [facility] in 1 week for repeat HCG and 2 weeks with a provider, message sent -Patient may return to MAU as needed or if her condition were to change or worsen  Wende Mott, CNM 04/08/2020, 12:29 PM

## 2020-04-08 NOTE — Discharge Instructions (Signed)

## 2020-04-08 NOTE — MAU Note (Signed)
Sharon Harvey is a 44 y.o. at [redacted]w[redacted]d here in MAU reporting: here for follow up hcg. States she is having light bleeding, no clots. No pain.  Onset of complaint: ongoing  Pain score: 0/10  Vitals:   04/08/20 1120  BP: (!) 147/80  Pulse: 95  Resp: 16  Temp: 98.6 F (37 C)  SpO2: 100%     Lab orders placed from triage: hcg

## 2020-04-14 ENCOUNTER — Telehealth: Payer: Self-pay | Admitting: Family Medicine

## 2020-04-14 NOTE — Telephone Encounter (Signed)
Called pt and left a voicemail regarding upcoming appointment dates and times.

## 2020-04-17 ENCOUNTER — Other Ambulatory Visit: Payer: Medicaid Other

## 2020-05-02 ENCOUNTER — Ambulatory Visit: Payer: Medicaid Other | Admitting: Certified Nurse Midwife

## 2020-06-15 ENCOUNTER — Other Ambulatory Visit: Payer: Self-pay

## 2020-06-15 ENCOUNTER — Encounter (HOSPITAL_COMMUNITY): Payer: Self-pay | Admitting: Obstetrics and Gynecology

## 2020-06-15 ENCOUNTER — Inpatient Hospital Stay (HOSPITAL_COMMUNITY): Payer: Medicaid Other

## 2020-06-15 ENCOUNTER — Inpatient Hospital Stay (HOSPITAL_COMMUNITY)
Admission: AD | Admit: 2020-06-15 | Discharge: 2020-06-15 | Disposition: A | Discharge status: Home / Self Care | Payer: Medicaid Other | Attending: Obstetrics and Gynecology | Admitting: Obstetrics and Gynecology

## 2020-06-15 DIAGNOSIS — O200 Threatened abortion: Secondary | ICD-10-CM | POA: Diagnosis not present

## 2020-06-15 DIAGNOSIS — O34219 Maternal care for unspecified type scar from previous cesarean delivery: Secondary | ICD-10-CM | POA: Insufficient documentation

## 2020-06-15 DIAGNOSIS — N939 Abnormal uterine and vaginal bleeding, unspecified: Secondary | ICD-10-CM | POA: Diagnosis not present

## 2020-06-15 DIAGNOSIS — Z7901 Long term (current) use of anticoagulants: Secondary | ICD-10-CM | POA: Diagnosis not present

## 2020-06-15 DIAGNOSIS — O3680X0 Pregnancy with inconclusive fetal viability, not applicable or unspecified: Secondary | ICD-10-CM | POA: Diagnosis not present

## 2020-06-15 DIAGNOSIS — O10011 Pre-existing essential hypertension complicating pregnancy, first trimester: Secondary | ICD-10-CM | POA: Insufficient documentation

## 2020-06-15 DIAGNOSIS — Z3A08 8 weeks gestation of pregnancy: Secondary | ICD-10-CM | POA: Diagnosis not present

## 2020-06-15 DIAGNOSIS — Z79899 Other long term (current) drug therapy: Secondary | ICD-10-CM | POA: Insufficient documentation

## 2020-06-15 DIAGNOSIS — O209 Hemorrhage in early pregnancy, unspecified: Secondary | ICD-10-CM | POA: Diagnosis not present

## 2020-06-15 LAB — CBC
HCT: 30.7 % — ABNORMAL LOW (ref 36.0–46.0)
Hemoglobin: 10.3 g/dL — ABNORMAL LOW (ref 12.0–15.0)
MCH: 22.3 pg — ABNORMAL LOW (ref 26.0–34.0)
MCHC: 33.6 g/dL (ref 30.0–36.0)
MCV: 66.5 fL — ABNORMAL LOW (ref 80.0–100.0)
Platelets: 198 10*3/uL (ref 150–400)
RBC: 4.62 MIL/uL (ref 3.87–5.11)
RDW: 16 % — ABNORMAL HIGH (ref 11.5–15.5)
WBC: 5.7 10*3/uL (ref 4.0–10.5)
nRBC: 0 % (ref 0.0–0.2)

## 2020-06-15 LAB — COMPREHENSIVE METABOLIC PANEL
ALT: 20 U/L (ref 0–44)
AST: 17 U/L (ref 15–41)
Albumin: 3 g/dL — ABNORMAL LOW (ref 3.5–5.0)
Alkaline Phosphatase: 57 U/L (ref 38–126)
Anion gap: 8 (ref 5–15)
BUN: 11 mg/dL (ref 6–20)
CO2: 23 mmol/L (ref 22–32)
Calcium: 9 mg/dL (ref 8.9–10.3)
Chloride: 105 mmol/L (ref 98–111)
Creatinine, Ser: 0.64 mg/dL (ref 0.44–1.00)
GFR, Estimated: >60 mL/min (ref >60)
Glucose, Bld: 107 mg/dL — ABNORMAL HIGH (ref 70–99)
Potassium: 3.5 mmol/L (ref 3.5–5.1)
Sodium: 136 mmol/L (ref 135–145)
Total Bilirubin: 0.6 mg/dL (ref 0.3–1.2)
Total Protein: 6.7 g/dL (ref 6.5–8.1)

## 2020-06-15 LAB — ABO/RH: ABO/RH(D): O POS

## 2020-06-15 LAB — URINALYSIS, ROUTINE W REFLEX MICROSCOPIC
Bacteria, UA: NONE SEEN
Bilirubin Urine: NEGATIVE
Glucose, UA: NEGATIVE mg/dL
Ketones, ur: NEGATIVE mg/dL
Leukocytes,Ua: NEGATIVE
Nitrite: NEGATIVE
Protein, ur: NEGATIVE mg/dL
Specific Gravity, Urine: 1.014 (ref 1.005–1.030)
pH: 7 (ref 5.0–8.0)

## 2020-06-15 LAB — HCG, QUANTITATIVE, PREGNANCY: hCG, Beta Chain, Quant, S: 54562 m[IU]/mL — ABNORMAL HIGH (ref <5)

## 2020-06-15 LAB — POCT PREGNANCY, URINE: Preg Test, Ur: POSITIVE — AB

## 2020-06-15 NOTE — MAU Note (Addendum)
Pt reports she miscarried 2 months ago.( not followed up since miscarriage)  Stated she has not had a period since miscarriage. She had a positive pregnancy test 3 weeks ago. Started bleeding and having cramping 2 days ago.

## 2020-06-15 NOTE — Discharge Instructions (Signed)
Threatened Miscarriage  A threatened miscarriage is when you have bleeding from your vagina during the first 20 weeks of pregnancy but the pregnancy does not end. Your doctor will do tests to make sure you are still pregnant. The cause of the bleeding may not be known. This condition does not mean your pregnancy will end, but it does increase the risk that it will end (complete miscarriage). Follow these instructions at home:  Get plenty of rest.  If you have bleeding in your vagina, do not have sex or use tampons.  Do not douche.  Do not smoke or use drugs.  Do not drink alcohol.  Avoid caffeine.  Keep all follow-up prenatal visits as told by your doctor. This is important. Contact a doctor if:  You have light bleeding from your vagina.  You have belly pain or cramping.  You have a fever. Get help right away if:  You have heavy bleeding from your vagina.  You have clots of blood coming from your vagina.  You pass tissue from your vagina.  You have a gush of fluid from your vagina.  You are leaking fluid from your vagina.  You have very bad pain or cramps in your low back or belly.  You have fever, chills, and very bad belly pain. Summary  A threatened miscarriage is when you have bleeding from your vagina during the first 20 weeks of pregnancy but the pregnancy does not end.  This condition does not mean your pregnancy will end, but it does increase the risk that it will end (complete miscarriage).  Get plenty of rest. If you have bleeding in your vagina, do not have sex or use tampons.  Keep all follow-up prenatal visits as told by your doctor. This is important. This information is not intended to replace advice given to you by your health care provider. Make sure you discuss any questions you have with your health care provider. Document Revised: 07/03/2017 Document Reviewed: 08/23/2016 Elsevier Patient Education  2020 Elsevier Inc.  

## 2020-06-15 NOTE — MAU Provider Note (Signed)
History     [id number]  Arrival date and time: 06/15/20 1230    Chief Complaint  Patient presents with  . Vaginal Bleeding     HPI Sharon Harvey is a 45 y.o. at unknown gestational age and PMHx notable for cHTN, uterine fibroids, AMA, CS x1 followed by VBAC, who presents for vaginal bleeding and cramping.   Patient seen in the MAU for vaginal bleeding at 7wks on 04/05/2020 At that time TVUS showed pregnancy of unknown location, hcg 207 D/c and returned on 04/08/2020 for hcg recheck which was 27 Diagnosed w SAB and instructed to follow up in clinic to trend hcg to normal Did not attend any further appts  Today presents reporting vaginal bleeding for the past two days Initially was just spotting but has subsequently gotten a little heavier, some small clots No abdominal pain, just some mild cramps No gush of fluid No fevers, n/v   --/--/O POS (01/06 1407)  OB History    Gravida  4   Para  2   Term  2   Preterm      AB      Living  2     SAB      IAB      Ectopic      Multiple  0   Live Births  2           Past Medical History:  Diagnosis Date  . Hypertension     Past Surgical History:  Procedure Laterality Date  . CESAREAN SECTION      Family History  Problem Relation Age of Onset  . ADD / ADHD Neg Hx   . Anxiety disorder Neg Hx   . Alcohol abuse Neg Hx   . Arthritis Neg Hx   . Asthma Neg Hx   . Birth defects Neg Hx   . Cancer Neg Hx   . COPD Neg Hx   . Depression Neg Hx   . Diabetes Neg Hx   . Drug abuse Neg Hx   . Early death Neg Hx   . Hearing loss Neg Hx   . Heart disease Neg Hx   . Hyperlipidemia Neg Hx   . Hypertension Neg Hx   . Intellectual disability Neg Hx   . Kidney disease Neg Hx   . Learning disabilities Neg Hx   . Miscarriages / Stillbirths Neg Hx   . Obesity Neg Hx   . Stroke Neg Hx   . Vision loss Neg Hx   . Varicose Veins Neg Hx   . Breast cancer Neg Hx     Social History   Socioeconomic History  .  Marital status: Married    Spouse name: Not on file  . Number of children: Not on file  . Years of education: Not on file  . Highest education level: Not on file  Occupational History  . Not on file  Tobacco Use  . Smoking status: Never Smoker  . Smokeless tobacco: Never Used  Substance and Sexual Activity  . Alcohol use: No  . Drug use: No  . Sexual activity: Yes    Birth control/protection: None  Other Topics Concern  . Not on file  Social History Narrative  . Not on file   Social Determinants of Health   Financial Resource Strain: Not on file  Food Insecurity: Not on file  Transportation Needs: Not on file  Physical Activity: Not on file  Stress: Not on file  Social Connections: Not on  file  Intimate Partner Violence: Not on file    No Known Allergies  No current facility-administered medications on file prior to encounter.   Current Outpatient Medications on File Prior to Encounter  Medication Sig Dispense Refill  . acetaminophen (TYLENOL) 325 MG tablet Take 2 tablets (650 mg total) by mouth every 4 (four) hours as needed (for pain scale < 4). (Patient not taking: Reported on 06/15/2018) 30 tablet 0  . amLODipine (NORVASC) 5 MG tablet Take 1 tablet (5 mg total) by mouth daily. 30 tablet 6  . ferrous sulfate 325 (65 FE) MG tablet Take 1 tablet (325 mg total) by mouth 2 (two) times daily with a meal. (Patient not taking: Reported on 06/15/2018) 60 tablet 0  . phenylephrine-shark liver oil-mineral oil-petrolatum (PREPARATION H) 0.25-14-74.9 % rectal ointment Place 1 application rectally 2 (two) times daily as needed for hemorrhoids. (Patient not taking: Reported on 06/15/2018) 1 Tube 2  . polyethylene glycol (MIRALAX) packet Take 17 g by mouth daily. (Patient not taking: Reported on 06/15/2018) 28 each 1  . Prenatal Vit-Fe Fumarate-FA (PRENATAL MULTIVITAMIN) TABS tablet Take 1 tablet by mouth daily at 12 noon.       ROS Pertinent positives and negative per HPI, all others  reviewed and negative  Physical Exam   BP (!) 141/63   Pulse 83   Temp 98.5 F (36.9 C)   Resp 18   LMP 02/13/2020   Breastfeeding Unknown   Physical Exam Vitals reviewed.  Constitutional:      General: She is not in acute distress.    Appearance: She is well-developed and well-nourished. She is not diaphoretic.  Eyes:     General: No scleral icterus. Pulmonary:     Effort: Pulmonary effort is normal. No respiratory distress.  Musculoskeletal:        General: No edema.  Skin:    General: Skin is warm and dry.  Neurological:     Mental Status: She is alert.     Coordination: Coordination normal.  Psychiatric:        Mood and Affect: Mood and affect normal.      Cervical Exam    Bedside Ultrasound Not done  My interpretation: n/a  FHT n/a  Labs Results for orders placed or performed during the hospital encounter of 06/15/20 (from the past 24 hour(s))  Pregnancy, urine POC     Status: Abnormal   Collection Time: 06/15/20  1:41 PM  Result Value Ref Range   Preg Test, Ur POSITIVE (A) NEGATIVE  Urinalysis, Routine w reflex microscopic Urine, Clean Catch     Status: Abnormal   Collection Time: 06/15/20  1:43 PM  Result Value Ref Range   Color, Urine YELLOW YELLOW   APPearance CLEAR CLEAR   Specific Gravity, Urine 1.014 1.005 - 1.030   pH 7.0 5.0 - 8.0   Glucose, UA NEGATIVE NEGATIVE mg/dL   Hgb urine dipstick MODERATE (A) NEGATIVE   Bilirubin Urine NEGATIVE NEGATIVE   Ketones, ur NEGATIVE NEGATIVE mg/dL   Protein, ur NEGATIVE NEGATIVE mg/dL   Nitrite NEGATIVE NEGATIVE   Leukocytes,Ua NEGATIVE NEGATIVE   RBC / HPF 6-10 0 - 5 RBC/hpf   WBC, UA 0-5 0 - 5 WBC/hpf   Bacteria, UA NONE SEEN NONE SEEN   Squamous Epithelial / LPF 0-5 0 - 5  CBC     Status: Abnormal   Collection Time: 06/15/20  2:07 PM  Result Value Ref Range   WBC 5.7 4.0 - 10.5 K/uL  RBC 4.62 3.87 - 5.11 MIL/uL   Hemoglobin 10.3 (L) 12.0 - 15.0 g/dL   HCT 00.9 (L) 38.1 - 82.9 %   MCV 66.5  (L) 80.0 - 100.0 fL   MCH 22.3 (L) 26.0 - 34.0 pg   MCHC 33.6 30.0 - 36.0 g/dL   RDW 93.7 (H) 16.9 - 67.8 %   Platelets 198 150 - 400 K/uL   nRBC 0.0 0.0 - 0.2 %  Comprehensive metabolic panel     Status: Abnormal   Collection Time: 06/15/20  2:07 PM  Result Value Ref Range   Sodium 136 135 - 145 mmol/L   Potassium 3.5 3.5 - 5.1 mmol/L   Chloride 105 98 - 111 mmol/L   CO2 23 22 - 32 mmol/L   Glucose, Bld 107 (H) 70 - 99 mg/dL   BUN 11 6 - 20 mg/dL   Creatinine, Ser 9.38 0.44 - 1.00 mg/dL   Calcium 9.0 8.9 - 10.1 mg/dL   Total Protein 6.7 6.5 - 8.1 g/dL   Albumin 3.0 (L) 3.5 - 5.0 g/dL   AST 17 15 - 41 U/L   ALT 20 0 - 44 U/L   Alkaline Phosphatase 57 38 - 126 U/L   Total Bilirubin 0.6 0.3 - 1.2 mg/dL   GFR, Estimated >75 >10 mL/min   Anion gap 8 5 - 15  ABO/Rh     Status: None   Collection Time: 06/15/20  2:07 PM  Result Value Ref Range   ABO/RH(D) O POS    No rh immune globuloin      NOT A RH IMMUNE GLOBULIN CANDIDATE, PT RH POSITIVE Performed at [facility], [address] [place], [place] [address]   hCG, quantitative, pregnancy     Status: Abnormal   Collection Time: 06/15/20  2:07 PM  Result Value Ref Range   hCG, Beta Chain, Quant, S 54,562 (H) <5 mIU/mL    Imaging US OB Transvaginal  Result Date: 06/15/2020 CLINICAL DATA:  Vaginal bleeding. EXAM: OBSTETRIC <14 WK [place] AND TRANSVAGINAL OB US TECHNIQUE: Both transabdominal and transvaginal ultrasound examinations were performed for complete evaluation of the gestation as well as the maternal uterus, adnexal regions, and pelvic cul-de-sac. Transvaginal technique was performed to assess early pregnancy. COMPARISON:  Ultrasound 04/05/2020. FINDINGS: Intrauterine gestational sac: Single Yolk sac:  Present Embryo:  Present Cardiac Activity: None visualized CRL: 21.8 mm   8 w   5 d                  [place] EDC: 01/20/2021 Subchorionic hemorrhage:  None visualized. Maternal uterus/adnexae: 1.5 cm uterine fibroid noted  anteriorly over the lower uterine segment. Other previously identified fibroids not well visualized. IMPRESSION: 1. Single intrauterine gestational sac. Yolk sac and embryo present. Gestational age [redacted] weeks 5 days. No cardiac activity visualized. Findings are suspicious but not yet definitive for failed pregnancy. Recommend follow-up US in 10-14 days for definitive diagnosis. This recommendation follows SRU consensus guidelines: Diagnostic Criteria for Nonviable Pregnancy Early in the First Trimester. Malva Limes Med 2013; 778:2423-53. 2. 1.5 cm fibroid noted over the lower uterine segment. The previously identified fibroids not well visualized. No evidence of subchorionic hemorrhage. Electronically Signed   By: Maisie Fus  Register   On: 06/15/2020 16:32    MAU Course  Procedures  Lab Orders     Wet prep, genital     Urinalysis, Routine w reflex microscopic Urine, Clean Catch     CBC     Comprehensive  metabolic panel     hCG, quantitative, pregnancy     Pregnancy, urine POC No orders of the defined types were placed in this encounter.   Imaging Orders     US OB Transvaginal     US OB LESS THAN 14 WEEKS WITH OB TRANSVAGINAL  MDM moderate  Assessment and Plan  #Threatened abortion IUP visualized on [place] measuring ~8wks but with no heartbeat, not yet diagnostic of miscarriage but highly likely. Discussed results with patient, this was not a planned pregnancy. Plan to follow up in 2 weeks at [facility] for [place] to determine viability. Reviewed return precautions. If not viable will discuss contraception at follow up visit. O+ blood type, no indication for rhogam.   D/c to home in stable condition.    Clarnce Flock

## 2020-06-20 ENCOUNTER — Telehealth: Payer: Self-pay | Admitting: Family Medicine

## 2020-06-20 NOTE — Telephone Encounter (Signed)
Per chart review has [place] scheduled for 06/29/20. I called [place] and notified her of appointment time/ address. She voices understanding. Jeidy Hoerner,RN

## 2020-06-20 NOTE — Telephone Encounter (Signed)
Patient calling requesting an Ultra Sound from when she was in the ED

## 2020-06-21 ENCOUNTER — Ambulatory Visit: Payer: Medicaid Other | Admitting: Obstetrics and Gynecology

## 2020-06-29 ENCOUNTER — Other Ambulatory Visit (HOSPITAL_COMMUNITY): Payer: Self-pay | Admitting: Family Medicine

## 2020-06-29 ENCOUNTER — Ambulatory Visit (INDEPENDENT_AMBULATORY_CARE_PROVIDER_SITE_OTHER): Payer: Medicaid Other | Admitting: Obstetrics & Gynecology

## 2020-06-29 ENCOUNTER — Ambulatory Visit
Admission: RE | Admit: 2020-06-29 | Discharge: 2020-06-29 | Disposition: A | Discharge status: Home / Self Care | Payer: Medicaid Other | Source: Ambulatory Visit | Attending: Family Medicine | Admitting: Family Medicine

## 2020-06-29 ENCOUNTER — Encounter: Payer: Self-pay | Admitting: Obstetrics & Gynecology

## 2020-06-29 ENCOUNTER — Other Ambulatory Visit: Payer: Self-pay

## 2020-06-29 VITALS — BP 158/92 | HR 93 | Wt 206.9 lb

## 2020-06-29 DIAGNOSIS — D259 Leiomyoma of uterus, unspecified: Secondary | ICD-10-CM | POA: Diagnosis not present

## 2020-06-29 DIAGNOSIS — O039 Complete or unspecified spontaneous abortion without complication: Secondary | ICD-10-CM | POA: Diagnosis not present

## 2020-06-29 DIAGNOSIS — Z30011 Encounter for initial prescription of contraceptive pills: Secondary | ICD-10-CM

## 2020-06-29 DIAGNOSIS — N858 Other specified noninflammatory disorders of uterus: Secondary | ICD-10-CM | POA: Diagnosis not present

## 2020-06-29 DIAGNOSIS — O200 Threatened abortion: Secondary | ICD-10-CM | POA: Diagnosis not present

## 2020-06-29 DIAGNOSIS — Z3A00 Weeks of gestation of pregnancy not specified: Secondary | ICD-10-CM | POA: Diagnosis not present

## 2020-06-29 MED ORDER — NORETHIN ACE-ETH ESTRAD-FE 1-20 MG-MCG(24) PO TABS: 1.0000 | ORAL_TABLET | Freq: Every day | ORAL | 11 refills | Status: AC

## 2020-06-29 NOTE — Progress Notes (Signed)
Ultrasounds Results Note  SUBJECTIVE HPI:  [name] is a 45 y.o. [id number] at Unknown by LMP who presents to the [facility] for followup ultrasound results. The patient denies abdominal pain or vaginal bleeding. She passed clots and tissue since her last [place] 06/15/20 Upon review of the patient's records, patient was first seen in MAU on 1/6 for bleeding.   BHCG on that day was 54562.  Ultrasound showed no FHM 8 w 5 days..  Repeat ultrasound was performed earlier today.   Past Medical History:  Diagnosis Date  . Hypertension    Past Surgical History:  Procedure Laterality Date  . CESAREAN SECTION     Social History   Socioeconomic History  . Marital status: Married    Spouse name: Not on file  . Number of children: Not on file  . Years of education: Not on file  . Highest education level: Not on file  Occupational History  . Not on file  Tobacco Use  . Smoking status: Never Smoker  . Smokeless tobacco: Never Used  Substance and Sexual Activity  . Alcohol use: No  . Drug use: No  . Sexual activity: Yes    Birth control/protection: None  Other Topics Concern  . Not on file  Social History Narrative  . Not on file   Social Determinants of Health   Financial Resource Strain: Not on file  Food Insecurity: Not on file  Transportation Needs: Not on file  Physical Activity: Not on file  Stress: Not on file  Social Connections: Not on file  Intimate Partner Violence: Not on file   Current Outpatient Medications on File Prior to Visit  Medication Sig Dispense Refill  . acetaminophen (TYLENOL) 325 MG tablet Take 2 tablets (650 mg total) by mouth every 4 (four) hours as needed (for pain scale < 4). (Patient not taking: Reported on 06/15/2018) 30 tablet 0  . amLODipine (NORVASC) 5 MG tablet Take 1 tablet (5 mg total) by mouth daily. 30 tablet 6  . ferrous sulfate 325 (65 FE) MG tablet Take 1 tablet (325 mg total) by mouth 2 (two) times daily with a meal. (Patient  not taking: Reported on 06/15/2018) 60 tablet 0  . phenylephrine-shark liver oil-mineral oil-petrolatum (PREPARATION H) 0.25-14-74.9 % rectal ointment Place 1 application rectally 2 (two) times daily as needed for hemorrhoids. (Patient not taking: Reported on 06/15/2018) 1 Tube 2  . polyethylene glycol (MIRALAX) packet Take 17 g by mouth daily. (Patient not taking: Reported on 06/15/2018) 28 each 1  . Prenatal Vit-Fe Fumarate-FA (PRENATAL MULTIVITAMIN) TABS tablet Take 1 tablet by mouth daily at 12 noon.     No current facility-administered medications on file prior to visit.   No Known Allergies  I have reviewed patient's Past Medical Hx, Surgical Hx, Family Hx, Social Hx, medications and allergies.   Review of Systems Review of Systems  Constitutional: Negative for fever and chills.  Gastrointestinal: Negative for nausea, vomiting, abdominal pain, diarrhea and constipation.  Genitourinary: Negative for dysuria.  Musculoskeletal: Negative for back pain.  Neurological: Negative for dizziness and weakness.    Physical Exam  BP (!) 158/92   Pulse 93   Wt 206 lb 14.4 oz (93.8 kg)   LMP 02/13/2020   BMI 35.51 kg/m   GENERAL: Well-developed, well-nourished female in no acute distress.  HEENT: Normocephalic, atraumatic.   LUNGS: Effort normal ABDOMEN: soft, non-tender HEART: Regular rate  SKIN: Warm, dry and without erythema PSYCH: Normal mood and affect NEURO:  Alert and oriented x 4  LAB RESULTS No results found for this or any previous visit (from the past 24 hour(s)).  IMAGING US OB Transvaginal  Result Date: 06/29/2020 CLINICAL DATA:  First trimester pregnancy, threatened abortion, assessment of viability; no LMP since spontaneous abortion on 02/13/2020. Quantitative HCG 54,562 on 06/15/2020 EXAM: TRANSVAGINAL OB ULTRASOUND TECHNIQUE: Transvaginal ultrasound was performed for complete evaluation of the gestation as well as the maternal uterus, adnexal regions, and pelvic cul-de-sac.  COMPARISON:  06/15/2020 FINDINGS: Intrauterine gestational sac: Absent; previously seen gestational sac no longer identified Yolk sac:  N/A Embryo:  N/A Cardiac Activity: N/A Heart Rate: N/A bpm MSD:   mm    w     d CRL:     mm    w  d                  [place] EDC: Subchorionic hemorrhage:  N/A Maternal uterus/adnexae: Uterus anteverted, with evidence of a prior Caesarean section scar. Previously identified gestational sac no longer identified. Endometrial complex normal appearance, 6 mm thick. No endometrial fluid or mass. Multiple uterine leiomyomata identified, at least 2 of which are calcified, 2.0 cm diameter intramural anterior upper uterus and 3.3 cm diameter posterior subserosal. Ovaries normal appearance bilaterally. No adnexal masses or free pelvic fluid. IMPRESSION: Previously identified intrauterine gestational sac is no longer seen consistent with failed pregnancy. Multiple uterine leiomyomata. Anterior wall uterine Caesarean section scar. Electronically Signed   By: Lavonia Dana M.D.   On: 06/29/2020 13:03   US OB Transvaginal  Result Date: 06/15/2020 CLINICAL DATA:  Vaginal bleeding. EXAM: OBSTETRIC <14 WK [place] AND TRANSVAGINAL OB US TECHNIQUE: Both transabdominal and transvaginal ultrasound examinations were performed for complete evaluation of the gestation as well as the maternal uterus, adnexal regions, and pelvic cul-de-sac. Transvaginal technique was performed to assess early pregnancy. COMPARISON:  Ultrasound 04/05/2020. FINDINGS: Intrauterine gestational sac: Single Yolk sac:  Present Embryo:  Present Cardiac Activity: None visualized CRL: 21.8 mm   8 w   5 d                  [place] EDC: 01/20/2021 Subchorionic hemorrhage:  None visualized. Maternal uterus/adnexae: 1.5 cm uterine fibroid noted anteriorly over the lower uterine segment. Other previously identified fibroids not well visualized. IMPRESSION: 1. Single intrauterine gestational sac. Yolk sac and embryo present. Gestational age [redacted] weeks 5  days. No cardiac activity visualized. Findings are suspicious but not yet definitive for failed pregnancy. Recommend follow-up US in 10-14 days for definitive diagnosis. This recommendation follows SRU consensus guidelines: Diagnostic Criteria for Nonviable Pregnancy Early in the First Trimester. [place] Med 2013; 630:1601-09. 2. 1.5 cm fibroid noted over the lower uterine segment. The previously identified fibroids not well visualized. No evidence of subchorionic hemorrhage. Electronically Signed   By: Marcello Moores  Register   On: 06/15/2020 16:32    ASSESSMENT 1. Oral contraception initial prescription   2. Complete miscarriage   3. Uterine leiomyoma, unspecified location     PLAN Discharge home in stable condition She would not like to conceive again and she wishes to have cycle control with contraception.  Meds ordered this encounter  Medications  . Norethindrone Acetate-Ethinyl Estrad-FE (LOESTRIN 24 FE) 1-20 MG-MCG(24) tablet    Sig: Take 1 tablet by mouth daily.    Dispense:  28 tablet    Refill:  11    rtc 3 months  Woodroe Mode, MD  06/29/2020  3:07 PM

## 2020-06-29 NOTE — Patient Instructions (Signed)
Oral Contraception Information Oral contraceptive pills (OCPs) are medicines taken by mouth to prevent pregnancy. They work by:  Preventing the ovaries from releasing eggs.  Thickening mucus in the lower part of the uterus (cervix). This prevents sperm from entering the uterus.  Thinning the lining of the uterus (endometrium). This prevents a fertilized egg from attaching to the endometrium. OCPs are highly effective when taken exactly as prescribed. However, OCPs do not prevent STIs (sexually transmitted infections). Using condoms while on an OCP can help prevent STIs. What happens before starting OCPs? Before you start taking OCPs:  You may have a physical exam, blood test, and Pap test.  Your health care provider will make sure you are a good candidate for oral contraception. OCPs are not a good option for certain women, such as: ? Women who smoke and are older than age 35. ? Women who have or have had certain conditions, such as:  A history of high blood pressure.  Deep vein thrombosis.  Pulmonary embolism.  Stroke.  Cardiovascular disease.  Peripheral vascular disease. Ask your health care provider about the possible side effects of the OCP you may be prescribed. Be aware that it can take 2-3 months for your body to adjust to changes in hormone levels. Types of oral contraception Birth control pills contain the hormones estrogen and progestin (synthetic progesterone) or progestin only. The combination pill This type of pill contains estrogen and progestin hormones.  Conventional contraception pills come in packs of 21 or 28 pills. ? Some packs with 28-day pills contain estrogen and progestin for the first 21-24 days. Hormone-free tablets, called placebos, are taken for the final 4-7 days. You should have menstrual bleeding during the time you take the placebos. ? In packs with 21 tablets, you take no pills for 7 days. Menstrual bleeding occurs during these days. (Some people  prefer taking a pill for 28 days to help establish a routine).  Extended-interval contraception pills come in packs of 91 pills. The first 84 tablets have both estrogen and progestin. The last 7 pills are placebos. Menstrual bleeding occurs during the placebo days. With this schedule, menstrual bleeding happens once every 3 months.  Continuous contraception pills come in packs of 28 pills. All pills in the pack contain estrogen and progestin. With this schedule, regular menstrual bleeding does not happen, but there may be spotting or irregular bleeding. Progestin-only pills This type of pill is often called the mini-pill and contains the progestin hormone only. It comes in packs of 28 pills. In some packs, the last 4 pills are placebos. The pill must be taken at the same time every day. This is very important to prevent pregnancy. Menstrual bleeding may not be regular or predictable.   What are the advantages? Oral contraception provides reliable and continuous contraception if taken as directed. It may treat or decrease symptoms of:  Menstrual period cramps.  Irregular menstrual cycle or bleeding.  Heavy menstrual flow.  Abnormal uterine bleeding.  Acne, depending on the type of pill.  Polycystic ovarian syndrome (POS).  Endometriosis.  Iron deficiency anemia.  Premenstrual symptoms, including severe irritability, depression, or anxiety. It also may:  Reduce the risk of endometrial and ovarian cancer.  Be used as emergency contraception.  Prevent ectopic pregnancies and infections of the fallopian tubes. What can make OCPs less effective? OCPs may be less effective if:  You forget to take the pill every day. For progestin-only pills, it is especially important to take the pill at the   same time each day. Even taking it 3 hours late can increase the risk of pregnancy.  You have a stomach or intestinal disease that reduces your body's ability to absorb the pill.  You take OCPs  with other medicines that make OCPs less effective, such as antibiotics, certain HIV medicines, and some seizure medicines.  You take expired OCPs.  You forget to restart the pill after 7 days of not taking it. This refers to the packs of 21 pills. What are the side effects and risks? OCPs can sometimes cause side effects, such as:  Headache.  Depression.  Trouble sleeping.  Nausea and vomiting.  Breast tenderness.  Irregular bleeding or spotting during the first several months.  Bloating or fluid retention.  Increase in blood pressure. Combination pills may slightly increase the risk of:  Blood clots.  Heart attack.  Stroke. Follow these instructions at home: Follow instructions from your health care provider about how to start taking your first cycle of OCPs. Depending on when you start the pill, you may need to use a backup form of birth control, such as condoms, during the first week. Make sure you know what steps to take if you forget to take the pill. Summary  Oral contraceptive pills (OCPs) are medicines taken by mouth to prevent pregnancy. They are highly effective when taken exactly as prescribed.  OCPs contain a combination of the hormones estrogen and progestin (synthetic progesterone) or progestin only.  Before you start taking the pill, you may have a physical exam, blood test, and Pap test. Your health care provider will make sure you are a good candidate for oral contraception.  The combination pill may come in a 21-day pack, a 28-day pack, or a 91-day pack. Progestin-only pills come in packs of 28 pills.  OCPs can sometimes cause side effects, such as headache, nausea, breast tenderness, or irregular bleeding. This information is not intended to replace advice given to you by your health care provider. Make sure you discuss any questions you have with your health care provider. Document Revised: 02/25/2020 Document Reviewed: 02/03/2020 Elsevier Patient  Education  2021 [employer]  

## 2021-02-07 DIAGNOSIS — Z111 Encounter for screening for respiratory tuberculosis: Secondary | ICD-10-CM | POA: Diagnosis not present

## 2021-03-25 ENCOUNTER — Ambulatory Visit (HOSPITAL_COMMUNITY)
Admission: EM | Admit: 2021-03-25 | Discharge: 2021-03-25 | Disposition: A | Discharge status: Home / Self Care | Payer: Medicaid Other | Attending: Emergency Medicine | Admitting: Emergency Medicine

## 2021-03-25 ENCOUNTER — Encounter (HOSPITAL_COMMUNITY): Payer: Self-pay | Admitting: Emergency Medicine

## 2021-03-25 ENCOUNTER — Other Ambulatory Visit: Payer: Self-pay

## 2021-03-25 DIAGNOSIS — I10 Essential (primary) hypertension: Secondary | ICD-10-CM

## 2021-03-25 MED ORDER — CLONIDINE HCL 0.1 MG PO TABS
ORAL_TABLET | ORAL | Status: AC
  Filled 2021-03-25: qty 1

## 2021-03-25 MED ORDER — CLONIDINE HCL 0.1 MG PO TABS
0.1000 mg | ORAL_TABLET | Freq: Once | ORAL | Status: AC
  Administered 2021-03-25: 0.1 mg via ORAL

## 2021-03-25 MED ORDER — AMLODIPINE BESYLATE 5 MG PO TABS: 5.0000 mg | ORAL_TABLET | Freq: Every day | ORAL | 2 refills | Status: DC

## 2021-03-25 NOTE — ED Provider Notes (Signed)
[facility]   [id number] 03/25/21 Arrival Time: 1628   Chief Complaint  Patient presents with   Hypertension     SUBJECTIVE: History from: patient.  Sharon Harvey is a 45 y.o. female who who presented to the urgent care for complaint of high blood pressure for the past few days.  Has been on Norvasc before discontinued medication.  States blood pressure on average is is below 140/80.  Does not currently take blood pressure medication.  Does not have a PCP.  Denies HA, vision changes, dizziness, lightheadedness, chest pain, shortness of breath, numbness or tingling in extremities, abdominal pain, changes in bowel or bladder habits.     ROS: As per HPI.  All other pertinent ROS negative.      Past Medical History:  Diagnosis Date   Hypertension    Past Surgical History:  Procedure Laterality Date   CESAREAN SECTION     No Known Allergies No current facility-administered medications on file prior to encounter.   Current Outpatient Medications on File Prior to Encounter  Medication Sig Dispense Refill   acetaminophen (TYLENOL) 325 MG tablet Take 2 tablets (650 mg total) by mouth every 4 (four) hours as needed (for pain scale < 4). (Patient not taking: No sig reported) 30 tablet 0   ferrous sulfate 325 (65 FE) MG tablet Take 1 tablet (325 mg total) by mouth 2 (two) times daily with a meal. (Patient not taking: No sig reported) 60 tablet 0   Norethindrone Acetate-Ethinyl Estrad-FE (LOESTRIN 24 FE) 1-20 MG-MCG(24) tablet Take 1 tablet by mouth daily. 28 tablet 11   phenylephrine-shark liver oil-mineral oil-petrolatum (PREPARATION H) 0.25-14-74.9 % rectal ointment Place 1 application rectally 2 (two) times daily as needed for hemorrhoids. (Patient not taking: No sig reported) 1 Tube 2   polyethylene glycol (MIRALAX) packet Take 17 g by mouth daily. (Patient not taking: No sig reported) 28 each 1   Prenatal Vit-Fe Fumarate-FA (PRENATAL MULTIVITAMIN) TABS tablet Take 1 tablet  by mouth daily at 12 noon.     Social History   Socioeconomic History   Marital status: Married    Spouse name: Not on file   Number of children: Not on file   Years of education: Not on file   Highest education level: Not on file  Occupational History   Not on file  Tobacco Use   Smoking status: Never   Smokeless tobacco: Never  Substance and Sexual Activity   Alcohol use: No   Drug use: No   Sexual activity: Yes    Birth control/protection: None  Other Topics Concern   Not on file  Social History Narrative   Not on file   Social Determinants of Health   Financial Resource Strain: Not on file  Food Insecurity: Not on file  Transportation Needs: Not on file  Physical Activity: Not on file  Stress: Not on file  Social Connections: Not on file  Intimate Partner Violence: Not on file   Family History  Problem Relation Age of Onset   ADD / ADHD Neg Hx    Anxiety disorder Neg Hx    Alcohol abuse Neg Hx    Arthritis Neg Hx    Asthma Neg Hx    Birth defects Neg Hx    Cancer Neg Hx    COPD Neg Hx    Depression Neg Hx    Diabetes Neg Hx    Drug abuse Neg Hx    Early death Neg Hx    Hearing  loss Neg Hx    Heart disease Neg Hx    Hyperlipidemia Neg Hx    Hypertension Neg Hx    Intellectual disability Neg Hx    Kidney disease Neg Hx    Learning disabilities Neg Hx    Miscarriages / Stillbirths Neg Hx    Obesity Neg Hx    Stroke Neg Hx    Vision loss Neg Hx    Varicose Veins Neg Hx    Breast cancer Neg Hx     OBJECTIVE:  Vitals:   03/25/21 1726 03/25/21 1728  BP:  (!) 181/102  Pulse: 86   Resp: 16   Temp: 98.5 F (36.9 C)   TempSrc: Oral   SpO2: 97%      Physical Exam Vitals and nursing note reviewed.  Constitutional:      General: She is not in acute distress.    Appearance: Normal appearance. She is normal weight. She is not ill-appearing, toxic-appearing or diaphoretic.  HENT:     Head: Normocephalic.  Cardiovascular:     Rate and Rhythm:  Normal rate and regular rhythm.     Pulses: Normal pulses.     Heart sounds: Normal heart sounds. No murmur heard.   No friction rub. No gallop.  Pulmonary:     Effort: Pulmonary effort is normal. No respiratory distress.     Breath sounds: Normal breath sounds. No stridor. No wheezing, rhonchi or rales.  Chest:     Chest wall: No tenderness.  Neurological:     General: No focal deficit present.     Mental Status: She is alert and oriented to person, place, and time.     GCS: GCS eye subscore is 4. GCS verbal subscore is 5. GCS motor subscore is 6.     Cranial Nerves: Cranial nerves are intact.     Sensory: Sensation is intact.     Motor: Motor function is intact.     Coordination: Coordination is intact.     Gait: Gait is intact.     LABS:  No results found for this or any previous visit (from the past 24 hour(s)).   ASSESSMENT & PLAN:  1. Essential hypertension     Meds ordered this encounter  Medications   amLODipine (NORVASC) 5 MG tablet    Sig: Take 1 tablet (5 mg total) by mouth daily.    Dispense:  30 tablet    Refill:  2   cloNIDine (CATAPRES) tablet 0.1 mg    Discharge instructions  Norvasc 5 mg was prescribed/take daily Follow-up and establish care with PCP Please continue to monitor blood pressure at home and keep a log Eat a well balanced diet of fruits, vegetables and lean meats.  Avoid foods high in fat and salt Drink water.  At least half your body weight in ounces Exercise for at least 30 minutes daily Return or go to the ED if you have any new or worsening symptoms such as vision changes, fatigue, dizziness, chest pain, shortness of breath, nausea, swelling in your hands or feet, urinary symptoms, etc...   Reviewed expectations re: course of current medical issues. Questions answered. Outlined signs and symptoms indicating need for more acute intervention. Patient verbalized understanding. After Visit Summary given.          Sharon Harvey, [name] 03/25/21 1810

## 2021-03-25 NOTE — ED Triage Notes (Signed)
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 She has been told multiple times it was high. Denies chest pain, shortness of breath, and headache.

## 2021-03-25 NOTE — Discharge Instructions (Addendum)
Norvasc 5 mg was prescribed/take daily Follow-up and establish care with PCP Please continue to monitor blood pressure at home and keep a log Eat a well balanced diet of fruits, vegetables and lean meats.  Avoid foods high in fat and salt Drink water.  At least half your body weight in ounces Exercise for at least 30 minutes daily Return or go to the ED if you have any new or worsening symptoms such as vision changes, fatigue, dizziness, chest pain, shortness of breath, nausea, swelling in your hands or feet, urinary symptoms, etc..[name]

## 2021-03-25 NOTE — ED Triage Notes (Signed)
She used to take amlodipine, her pressure improved, she discontinued med.

## 2021-05-15 DIAGNOSIS — Z136 Encounter for screening for cardiovascular disorders: Secondary | ICD-10-CM | POA: Diagnosis not present

## 2021-05-15 DIAGNOSIS — Z1329 Encounter for screening for other suspected endocrine disorder: Secondary | ICD-10-CM | POA: Diagnosis not present

## 2021-05-15 DIAGNOSIS — Z2821 Immunization not carried out because of patient refusal: Secondary | ICD-10-CM | POA: Diagnosis not present

## 2021-05-15 DIAGNOSIS — Z0001 Encounter for general adult medical examination with abnormal findings: Secondary | ICD-10-CM | POA: Diagnosis not present

## 2021-05-15 DIAGNOSIS — R739 Hyperglycemia, unspecified: Secondary | ICD-10-CM | POA: Diagnosis not present

## 2021-05-15 DIAGNOSIS — Z6835 Body mass index (BMI) 35.0-35.9, adult: Secondary | ICD-10-CM | POA: Diagnosis not present

## 2021-05-15 DIAGNOSIS — Z131 Encounter for screening for diabetes mellitus: Secondary | ICD-10-CM | POA: Diagnosis not present

## 2021-05-15 DIAGNOSIS — D509 Iron deficiency anemia, unspecified: Secondary | ICD-10-CM | POA: Diagnosis not present

## 2021-05-15 DIAGNOSIS — I10 Essential (primary) hypertension: Secondary | ICD-10-CM | POA: Diagnosis not present

## 2021-05-22 DIAGNOSIS — R7303 Prediabetes: Secondary | ICD-10-CM | POA: Diagnosis not present

## 2021-05-22 DIAGNOSIS — Z6835 Body mass index (BMI) 35.0-35.9, adult: Secondary | ICD-10-CM | POA: Diagnosis not present

## 2021-05-22 DIAGNOSIS — D509 Iron deficiency anemia, unspecified: Secondary | ICD-10-CM | POA: Diagnosis not present

## 2021-05-22 DIAGNOSIS — I10 Essential (primary) hypertension: Secondary | ICD-10-CM | POA: Diagnosis not present

## 2021-05-29 ENCOUNTER — Encounter: Payer: Self-pay | Admitting: Gastroenterology

## 2021-07-23 ENCOUNTER — Other Ambulatory Visit: Payer: Self-pay

## 2021-07-23 ENCOUNTER — Ambulatory Visit (AMBULATORY_SURGERY_CENTER): Payer: Medicaid Other | Admitting: *Deleted

## 2021-07-23 VITALS — Ht 64.0 in | Wt 190.0 lb

## 2021-07-23 DIAGNOSIS — Z1211 Encounter for screening for malignant neoplasm of colon: Secondary | ICD-10-CM

## 2021-07-23 MED ORDER — PLENVU 140 G PO SOLR
1.0000 | Freq: Once | ORAL | 0 refills | Status: AC
Start: 2021-07-23 — End: 2021-07-23

## 2021-07-23 NOTE — Progress Notes (Signed)
Patient's pre-visit was done today over the phone with the patient. Name,DOB and address verified. Patient denies any allergies to Eggs and Soy. Patient denies any problems with anesthesia/sedation. Patient is not taking any diet pills or blood thinners. No home Oxygen.   Prep instructions mailed to pt-pt aware. Patient understands to call us back with any questions or concerns. Patient is aware of our care-partner policy and [id number] safety protocol.   The patient is COVID-19 vaccinated.

## 2021-07-24 DIAGNOSIS — Z6835 Body mass index (BMI) 35.0-35.9, adult: Secondary | ICD-10-CM | POA: Diagnosis not present

## 2021-07-24 DIAGNOSIS — I10 Essential (primary) hypertension: Secondary | ICD-10-CM | POA: Diagnosis not present

## 2021-07-24 DIAGNOSIS — R7303 Prediabetes: Secondary | ICD-10-CM | POA: Diagnosis not present

## 2021-07-24 DIAGNOSIS — D509 Iron deficiency anemia, unspecified: Secondary | ICD-10-CM | POA: Diagnosis not present

## 2021-08-06 ENCOUNTER — Encounter: Payer: Self-pay | Admitting: Gastroenterology

## 2021-08-06 ENCOUNTER — Other Ambulatory Visit: Payer: Self-pay

## 2021-08-06 ENCOUNTER — Ambulatory Visit (AMBULATORY_SURGERY_CENTER): Payer: Medicaid Other | Admitting: Gastroenterology

## 2021-08-06 VITALS — BP 122/83 | HR 77 | Temp 97.8°F | Resp 14 | Ht 64.0 in | Wt 190.0 lb

## 2021-08-06 DIAGNOSIS — Z1211 Encounter for screening for malignant neoplasm of colon: Secondary | ICD-10-CM | POA: Diagnosis not present

## 2021-08-06 DIAGNOSIS — D124 Benign neoplasm of descending colon: Secondary | ICD-10-CM

## 2021-08-06 MED ORDER — SODIUM CHLORIDE 0.9 % IV SOLN: 500.0000 mL | Freq: Once | INTRAVENOUS | Status: DC

## 2021-08-06 NOTE — Patient Instructions (Signed)
Handouts on polyps, diverticulosis and hemorrhoids given.     YOU HAD AN ENDOSCOPIC PROCEDURE TODAY AT [facility]:   Refer to the procedure report that was given to you for any specific questions about what was found during the examination.  If the procedure report does not answer your questions, please call your gastroenterologist to clarify.  If you requested that your care partner not be given the details of your procedure findings, then the procedure report has been included in a sealed envelope for you to review at your convenience later.  YOU SHOULD EXPECT: Some feelings of bloating in the abdomen. Passage of more gas than usual.  Walking can help get rid of the air that was put into your GI tract during the procedure and reduce the bloating. If you had a lower endoscopy (such as a colonoscopy or flexible sigmoidoscopy) you may notice spotting of blood in your stool or on the toilet paper. If you underwent a bowel prep for your procedure, you may not have a normal bowel movement for a few days.  Please Note:  You might notice some irritation and congestion in your nose or some drainage.  This is from the oxygen used during your procedure.  There is no need for concern and it should clear up in a day or so.  SYMPTOMS TO REPORT IMMEDIATELY:  Following lower endoscopy (colonoscopy or flexible sigmoidoscopy):  Excessive amounts of blood in the stool  Significant tenderness or worsening of abdominal pains  Swelling of the abdomen that is new, acute  Fever of 100F or higher  For urgent or emergent issues, a gastroenterologist can be reached at any hour by calling [phone]. Do not use MyChart messaging for urgent concerns.    DIET:  We do recommend a small meal at first, but then you may proceed to your regular diet.  Drink plenty of fluids but you should avoid alcoholic beverages for 24 hours.  ACTIVITY:  You should plan to take it easy for the rest of today and you  should NOT DRIVE or use heavy machinery until tomorrow (because of the sedation medicines used during the test).    FOLLOW UP: Our staff will call the number listed on your records 48-72 hours following your procedure to check on you and address any questions or concerns that you may have regarding the information given to you following your procedure. If we do not reach you, we will leave a message.  We will attempt to reach you two times.  During this call, we will ask if you have developed any symptoms of COVID 19. If you develop any symptoms (ie: fever, flu-like symptoms, shortness of breath, cough etc.) before then, please call [phone].  If you test positive for Covid 19 in the 2 weeks post procedure, please call and report this information to [place].    If any biopsies were taken you will be contacted by phone or by letter within the next 1-3 weeks.  Please call us at [phone] if you have not heard about the biopsies in 3 weeks.    SIGNATURES/CONFIDENTIALITY: You and/or your care partner have signed paperwork which will be entered into your electronic medical record.  These signatures attest to the fact that that the information above on your After Visit Summary has been reviewed and is understood.  Full responsibility of the confidentiality of this discharge information lies with you and/or your care-partner.

## 2021-08-06 NOTE — Progress Notes (Signed)
A/ox3, pleased with MAC, report to RN 

## 2021-08-06 NOTE — Progress Notes (Signed)
Pt's states no medical or surgical changes since previsit or office visit. VS assessed by D.T 

## 2021-08-06 NOTE — Progress Notes (Signed)
Called to room to assist during endoscopic procedure.  Patient ID and intended procedure confirmed with present staff. Received instructions for my participation in the procedure from the performing physician.  

## 2021-08-06 NOTE — Op Note (Signed)
[facility] Patient Name: Sharon Harvey Procedure Date: 08/06/2021 10:43 AM MRN: [id number] Endoscopist: Mauri Pole , MD Age: 46 Referring MD:  Date of Birth: 07-24-1975 Gender: Female Account #: [id number] Procedure:                Colonoscopy Indications:              Screening for colorectal malignant neoplasm Medicines:                Monitored Anesthesia Care Procedure:                Pre-Anesthesia Assessment:                           - Prior to the procedure, a History and Physical                            was performed, and patient medications and                            allergies were reviewed. The patient's tolerance of                            previous anesthesia was also reviewed. The risks                            and benefits of the procedure and the sedation                            options and risks were discussed with the patient.                            All questions were answered, and informed consent                            was obtained. Prior Anticoagulants: The patient has                            taken no previous anticoagulant or antiplatelet                            agents. ASA Grade Assessment: II - A patient with                            mild systemic disease. After reviewing the risks                            and benefits, the patient was deemed in                            satisfactory condition to undergo the procedure.                           After obtaining informed consent, the colonoscope  was passed under direct vision. Throughout the                            procedure, the patient's blood pressure, pulse, and                            oxygen saturations were monitored continuously. The                            [id number] [id number]-[id number] Slim SB Colonoscope was introduced                            through the anus and advanced to the the cecum,                            identified  by appendiceal orifice and ileocecal                            valve. The colonoscopy was performed without                            difficulty. The patient tolerated the procedure                            well. The quality of the bowel preparation was                            good. The ileocecal valve, appendiceal orifice, and                            rectum were photographed. Scope In: 10:55:46 AM Scope Out: 11:14:41 AM Scope Withdrawal Time: 0 hours 7 minutes 22 seconds  Total Procedure Duration: 0 hours 18 minutes 55 seconds  Findings:                 The perianal and digital rectal examinations were                            normal.                           A 5 mm polyp was found in the descending colon. The                            polyp was sessile. The polyp was removed with a                            cold snare. Resection and retrieval were complete.                           A few small-mouthed diverticula were found in the                            sigmoid colon and ascending colon.  Non-bleeding external and internal hemorrhoids were                            found during retroflexion. The hemorrhoids were                            medium-sized. Complications:            No immediate complications. Estimated Blood Loss:     Estimated blood loss was minimal. Impression:               - One 5 mm polyp in the descending colon, removed                            with a cold snare. Resected and retrieved.                           - Diverticulosis in the sigmoid colon and in the                            ascending colon.                           - Non-bleeding external and internal hemorrhoids. Recommendation:           - Patient has a contact number available for                            emergencies. The signs and symptoms of potential                            delayed complications were discussed with the                             patient. Return to normal activities tomorrow.                            Written discharge instructions were provided to the                            patient.                           - Resume previous diet.                           - Continue present medications.                           - Await pathology results.                           - Repeat colonoscopy in 5-10 years for surveillance                            based on pathology results. Mauri Pole, MD 08/06/2021 11:23:07 AM This report has been signed electronically.

## 2021-08-06 NOTE — Progress Notes (Signed)
[facility] History and Physical   Primary Care Physician:  Benito Mccreedy, MD   Reason for Procedure:  Colorectal cancer screening  Plan:    Screening colonoscopy with possible interventions as needed     HPI: Sharon Harvey is a very pleasant 46 y.o. female here for screening colonoscopy. Denies any nausea, vomiting, abdominal pain, melena or bright red blood per rectum  The risks and benefits as well as alternatives of endoscopic procedure(s) have been discussed and reviewed. All questions answered. The patient agrees to proceed.    Past Medical History:  Diagnosis Date   Hypertension     Past Surgical History:  Procedure Laterality Date   CESAREAN SECTION      Prior to Admission medications   Medication Sig Start Date End Date Taking? Authorizing Provider  amLODipine-olmesartan (AZOR) 5-20 MG tablet Take 1 tablet by mouth daily. 05/15/21  Yes [provider]  Norethindrone Acetate-Ethinyl Estrad-FE (LOESTRIN 24 FE) 1-20 MG-MCG(24) tablet Take 1 tablet by mouth daily. 06/29/20   Woodroe Mode, MD  phenylephrine-shark liver oil-mineral oil-petrolatum (PREPARATION H) 0.25-14-74.9 % rectal ointment Place 1 application rectally 2 (two) times daily as needed for hemorrhoids. Patient not taking: Reported on 07/23/2021 11/05/17   Aletha Halim, MD    Current Outpatient Medications  Medication Sig Dispense Refill   amLODipine-olmesartan (AZOR) 5-20 MG tablet Take 1 tablet by mouth daily.     Norethindrone Acetate-Ethinyl Estrad-FE (LOESTRIN 24 FE) 1-20 MG-MCG(24) tablet Take 1 tablet by mouth daily. 28 tablet 11   phenylephrine-shark liver oil-mineral oil-petrolatum (PREPARATION H) 0.25-14-74.9 % rectal ointment Place 1 application rectally 2 (two) times daily as needed for hemorrhoids. (Patient not taking: Reported on 07/23/2021) 1 Tube 2   Current Facility-Administered Medications  Medication Dose Route Frequency Provider Last Rate Last Admin   0.9  %  sodium chloride infusion  500 mL Intravenous Once Mauri Pole, MD        Allergies as of 08/06/2021   (No Known Allergies)    Family History  Problem Relation Age of Onset   ADD / ADHD Neg Hx    Anxiety disorder Neg Hx    Alcohol abuse Neg Hx    Arthritis Neg Hx    Asthma Neg Hx    Birth defects Neg Hx    Cancer Neg Hx    COPD Neg Hx    Depression Neg Hx    Diabetes Neg Hx    Drug abuse Neg Hx    Early death Neg Hx    Hearing loss Neg Hx    Heart disease Neg Hx    Hyperlipidemia Neg Hx    Hypertension Neg Hx    Intellectual disability Neg Hx    Kidney disease Neg Hx    Learning disabilities Neg Hx    Miscarriages / Stillbirths Neg Hx    Obesity Neg Hx    Stroke Neg Hx    Vision loss Neg Hx    Varicose Veins Neg Hx    Breast cancer Neg Hx    Colon cancer Neg Hx     Social History   Socioeconomic History   Marital status: Married    Spouse name: Not on file   Number of children: Not on file   Years of education: Not on file   Highest education level: Not on file  Occupational History   Not on file  Tobacco Use   Smoking status: Never   Smokeless tobacco: Never  Vaping Use   Vaping  Use: Never used  Substance and Sexual Activity   Alcohol use: No   Drug use: No   Sexual activity: Yes    Birth control/protection: None  Other Topics Concern   Not on file  Social History Narrative   Not on file   Social Determinants of Health   Financial Resource Strain: Not on file  Food Insecurity: Not on file  Transportation Needs: Not on file  Physical Activity: Not on file  Stress: Not on file  Social Connections: Not on file  Intimate Partner Violence: Not on file    Review of Systems:  All other review of systems negative except as mentioned in the HPI.  Physical Exam: Vital signs in last 24 hours: BP (!) 158/85    Pulse 80    Temp 97.8 F (36.6 C) (Skin)    Ht 5\' 4"  (1.626 m)    Wt 190 lb (86.2 kg)    SpO2 99%    BMI 32.61 kg/m  General:    Alert, NAD Lungs:  Clear .   Heart:  Regular rate and rhythm Abdomen:  Soft, nontender and nondistended. Neuro/Psych:  Alert and cooperative. Normal mood and affect. A and O x 3  Reviewed labs, radiology imaging, old records and pertinent past GI work up  Patient is appropriate for planned procedure(s) and anesthesia in an ambulatory setting   K. Denzil Magnuson , MD [phone]

## 2021-08-08 ENCOUNTER — Telehealth: Payer: Self-pay | Admitting: *Deleted

## 2021-08-08 NOTE — Telephone Encounter (Signed)
?  Follow up Call- ? ?Call back number 08/06/2021  ?Post procedure Call Back phone  # [phone]  ?Permission to leave phone message Yes  ?Some recent data might be hidden  ?  ? ?Patient questions: ? ?Do you have a fever, pain , or abdominal swelling? No. ?Pain Score  0 * ? ?Have you tolerated food without any problems? Yes.   ? ?Have you been able to return to your normal activities? Yes.   ? ?Do you have any questions about your discharge instructions: ?Diet   No. ?Medications  No. ?Follow up visit  No. ? ?Do you have questions or concerns about your Care? No. ? ?Actions: ?* If pain score is 4 or above: ?No action needed, pain <4. ? ? ?

## 2021-08-16 ENCOUNTER — Encounter: Payer: Self-pay | Admitting: Gastroenterology

## 2021-11-22 DIAGNOSIS — D509 Iron deficiency anemia, unspecified: Secondary | ICD-10-CM | POA: Diagnosis not present

## 2021-11-22 DIAGNOSIS — I10 Essential (primary) hypertension: Secondary | ICD-10-CM | POA: Diagnosis not present

## 2021-11-22 DIAGNOSIS — R7303 Prediabetes: Secondary | ICD-10-CM | POA: Diagnosis not present

## 2022-01-09 IMAGING — US US OB < 14 WEEKS - US OB TV
1 series · 15 of 28 positions shown · non-contrast
Comparison: None.

CLINICAL DATA: Spotting

EXAM:
OBSTETRIC <14 WK US AND TRANSVAGINAL OB US
TECHNIQUE: Both transabdominal and transvaginal ultrasound examinations were
performed for complete evaluation of the gestation as well as the
maternal uterus, adnexal regions, and pelvic cul-de-sac.
Transvaginal technique was performed to assess early pregnancy.

[Series 1: us ob < 14 weeks - us ob tv · 15 of 59 slices shown]
[im 1/59]
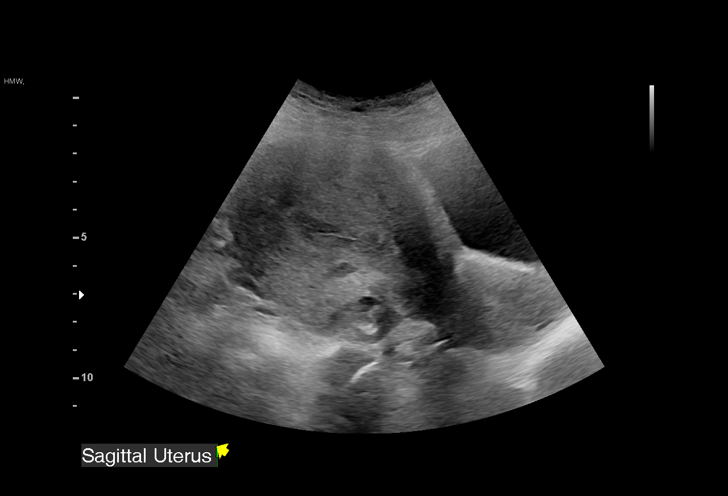
[im 5/59]
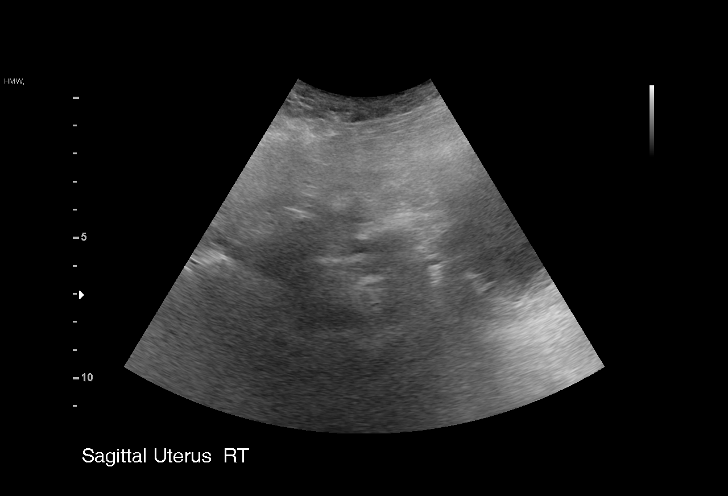
[im 9/59]
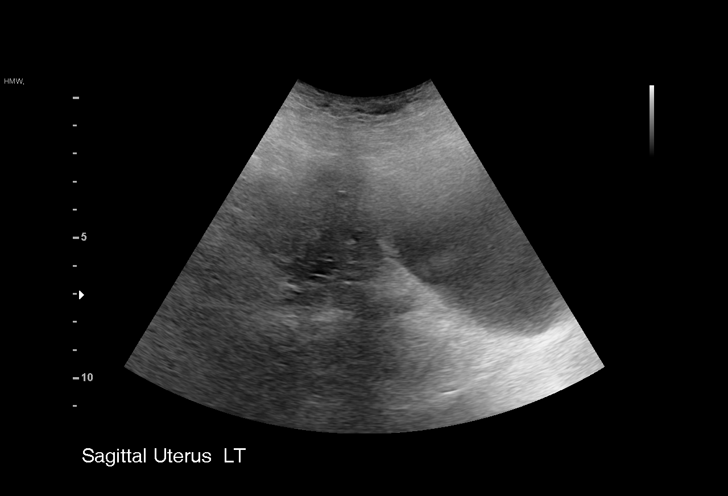
[im 13/59]
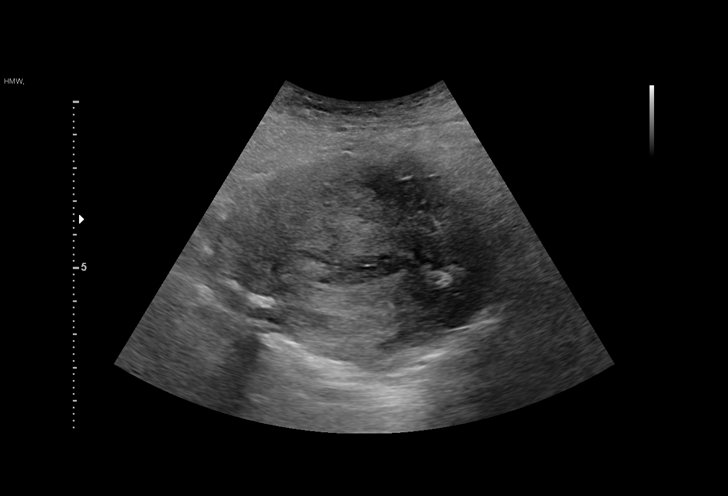
[im 18/59]
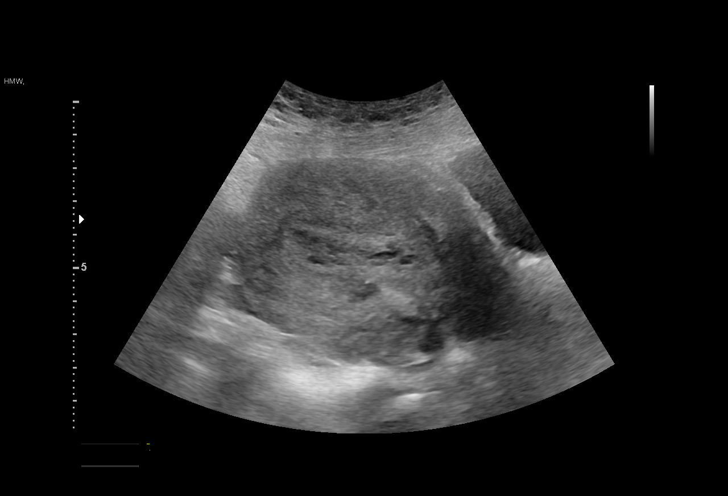
[im 22/59]
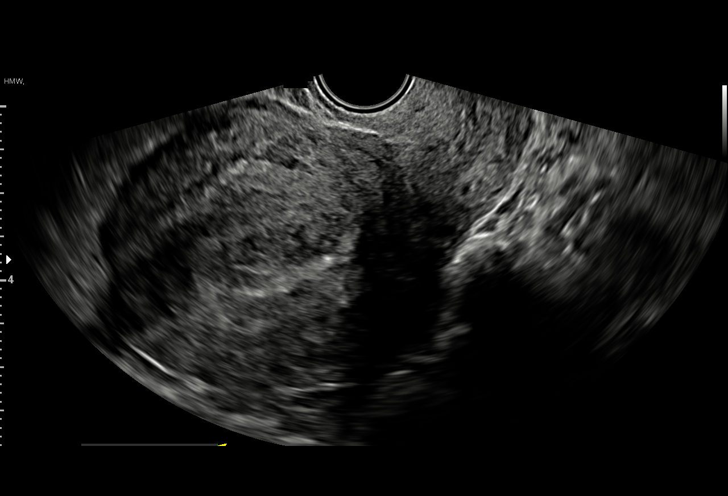
[im 26/59]
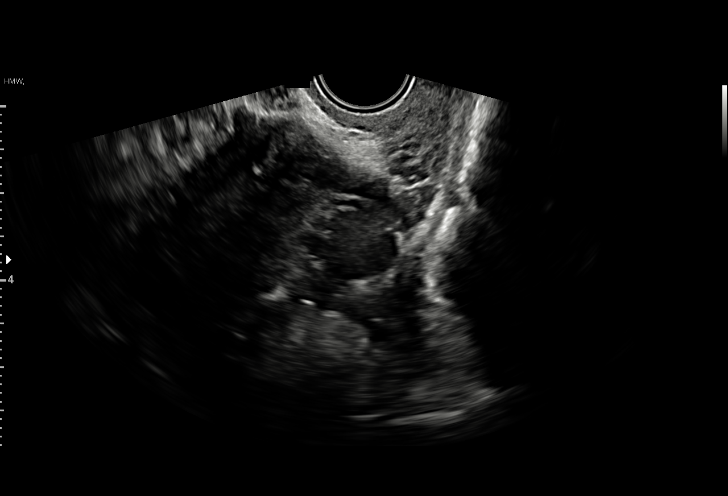
[im 31/59]
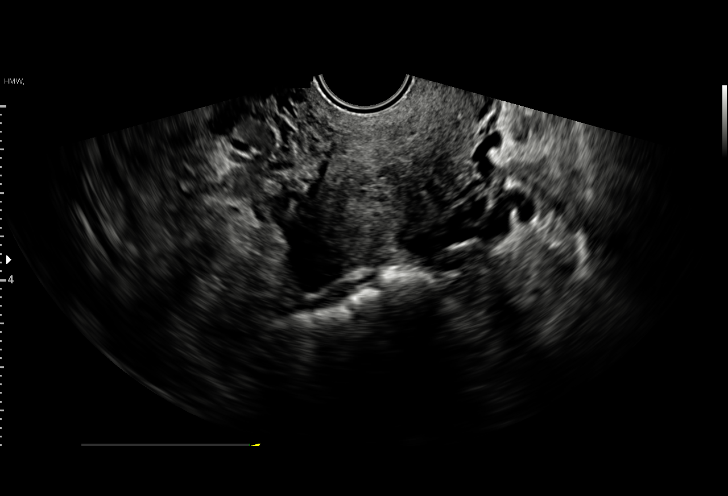
[im 33/59]
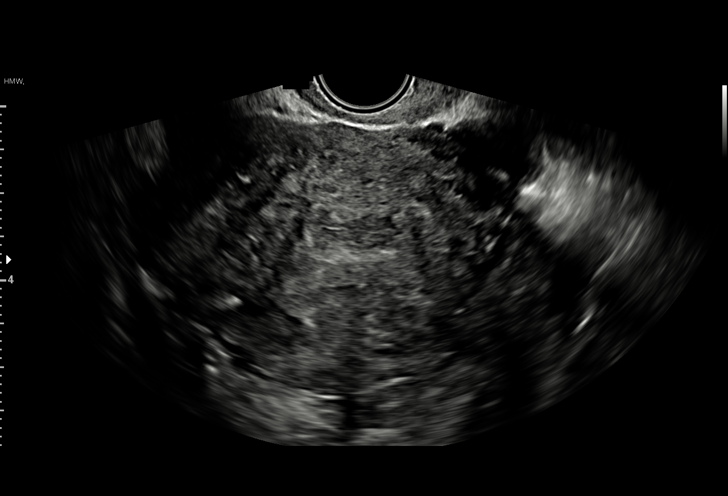
[im 37/59]
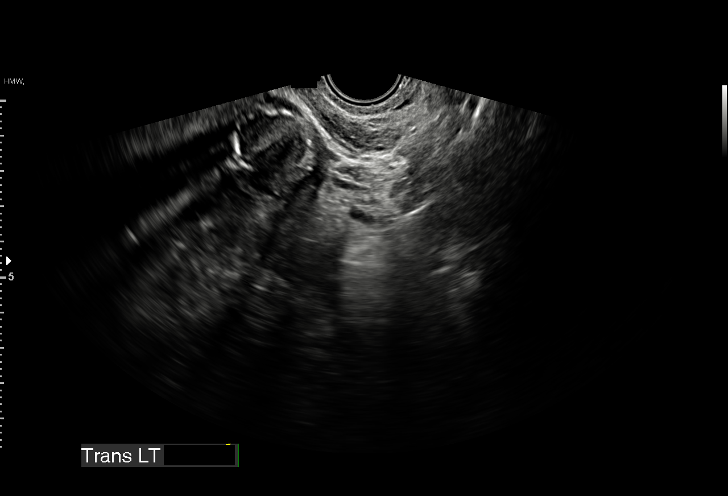
[im 41/59]
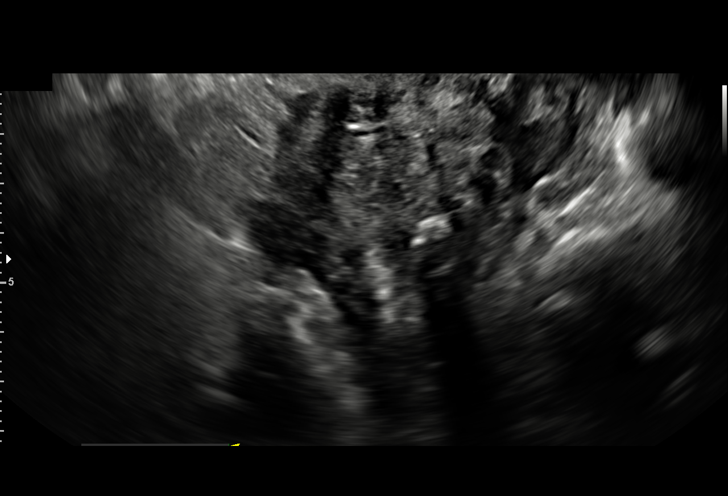
[im 46/59]
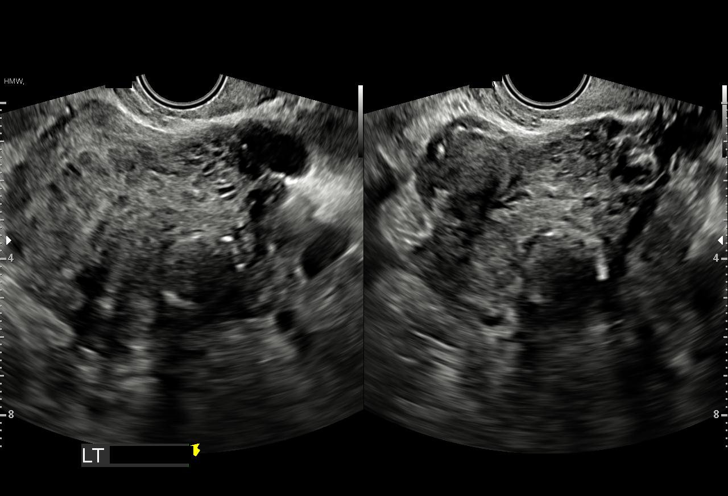
[im 50/59]
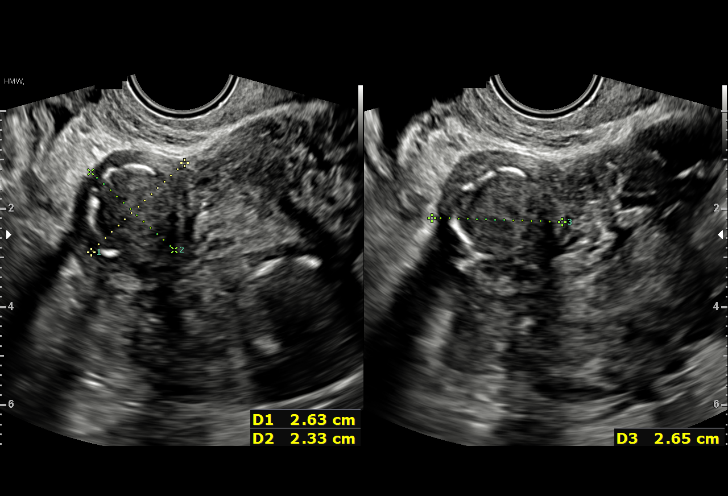
[im 54/59]
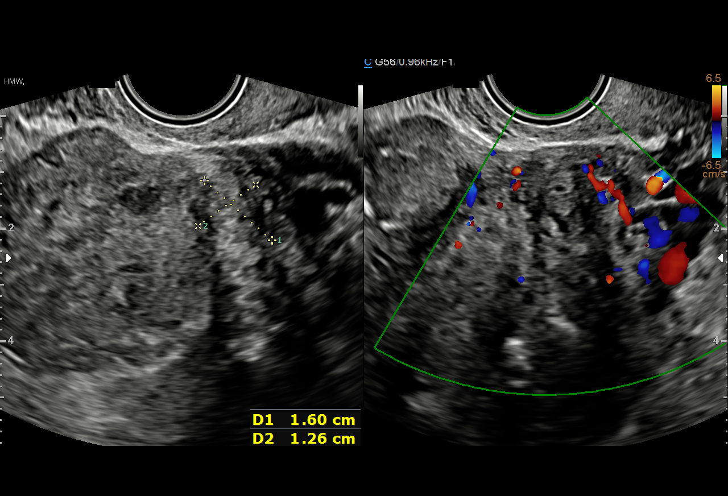
[im 59/59]
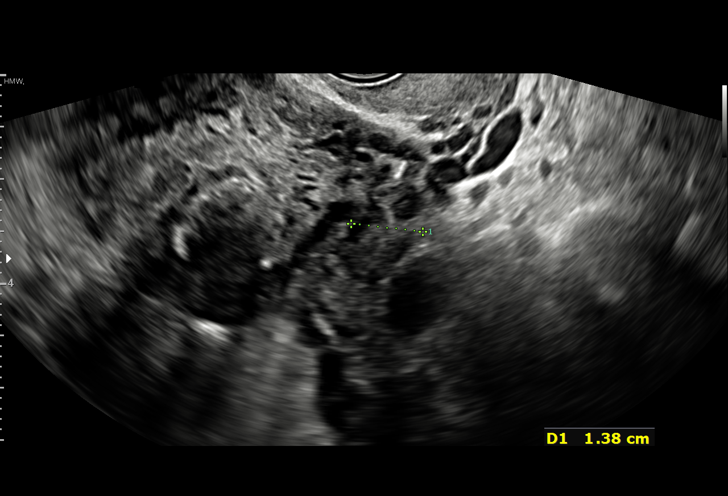

[15 of 28 positions shown; findings below may reference images not displayed]

FINDINGS: Intrauterine gestational sac: None

Yolk sac:  Not Visualized.

Embryo:  Not Visualized.

Maternal uterus/adnexae: Multiple hypoechoic uterine masses, some
with peripheral calcification. Right fundal mass measures 2.6 x
x 2.7 cm. Left posterior fundal mass measures 2.4 x 2.2 x 2.3 cm.
Mid uterine mass measures 1.6 x 1.3 x 2 cm. Ovaries are within
normal limits. Left ovary measures 1.4 x 2.8 x 1.3 cm. Right ovary
measures 1.6 x 2.2 x 1 cm. No significant free fluid.
IMPRESSION: 1. No IUP identified. Findings consistent with pregnancy of unknown
location, differential of which includes IUP too early to visualize,
recent failed pregnancy, and occult ectopic pregnancy. Recommend
trending of HCG with repeat ultrasound as indicated
2. Multiple uterine fibroids.

## 2022-02-05 DIAGNOSIS — I10 Essential (primary) hypertension: Secondary | ICD-10-CM | POA: Diagnosis not present

## 2022-02-05 DIAGNOSIS — D509 Iron deficiency anemia, unspecified: Secondary | ICD-10-CM | POA: Diagnosis not present

## 2022-02-05 DIAGNOSIS — Z0001 Encounter for general adult medical examination with abnormal findings: Secondary | ICD-10-CM | POA: Diagnosis not present

## 2022-02-05 DIAGNOSIS — R7303 Prediabetes: Secondary | ICD-10-CM | POA: Diagnosis not present

## 2022-05-22 DIAGNOSIS — Z0001 Encounter for general adult medical examination with abnormal findings: Secondary | ICD-10-CM | POA: Diagnosis not present

## 2022-05-22 DIAGNOSIS — R7303 Prediabetes: Secondary | ICD-10-CM | POA: Diagnosis not present

## 2022-05-22 DIAGNOSIS — I10 Essential (primary) hypertension: Secondary | ICD-10-CM | POA: Diagnosis not present

## 2022-05-22 DIAGNOSIS — D509 Iron deficiency anemia, unspecified: Secondary | ICD-10-CM | POA: Diagnosis not present

## 2022-07-02 DIAGNOSIS — Z3009 Encounter for other general counseling and advice on contraception: Secondary | ICD-10-CM | POA: Diagnosis not present

## 2022-07-02 DIAGNOSIS — Z113 Encounter for screening for infections with a predominantly sexual mode of transmission: Secondary | ICD-10-CM | POA: Diagnosis not present

## 2022-07-02 DIAGNOSIS — Z114 Encounter for screening for human immunodeficiency virus [HIV]: Secondary | ICD-10-CM | POA: Diagnosis not present

## 2022-07-02 DIAGNOSIS — Z130 Encounter for screening for diseases of the blood and blood-forming organs and certain disorders involving the immune mechanism: Secondary | ICD-10-CM | POA: Diagnosis not present

## 2022-07-02 DIAGNOSIS — Z30012 Encounter for prescription of emergency contraception: Secondary | ICD-10-CM | POA: Diagnosis not present

## 2023-09-20 ENCOUNTER — Other Ambulatory Visit (HOSPITAL_COMMUNITY): Payer: Self-pay | Admitting: Family Medicine

## 2023-09-20 ENCOUNTER — Ambulatory Visit (HOSPITAL_COMMUNITY)
Admission: RE | Admit: 2023-09-20 | Discharge: 2023-09-20 | Disposition: A | Discharge status: Home / Self Care | Payer: Self-pay | Source: Ambulatory Visit | Attending: Family Medicine | Admitting: Family Medicine

## 2023-09-20 ENCOUNTER — Ambulatory Visit (HOSPITAL_COMMUNITY)
Admission: RE | Admit: 2023-09-20 | Discharge: 2023-09-20 | Disposition: A | Discharge status: Home / Self Care | Payer: Self-pay | Source: Other Acute Inpatient Hospital | Attending: Family Medicine | Admitting: Family Medicine

## 2023-09-20 DIAGNOSIS — R7612 Nonspecific reaction to cell mediated immunity measurement of gamma interferon antigen response without active tuberculosis: Secondary | ICD-10-CM | POA: Insufficient documentation
